# Patient Record
Sex: Female | Born: 1990 | Race: Black or African American | Hispanic: No | Marital: Single | Smoking: Current some day smoker
Health system: Southern US, Community
[De-identification: ages and names within clinical notes are randomized; demographics above are authoritative.]

## PROBLEM LIST (undated history)

## (undated) DIAGNOSIS — F329 Major depressive disorder, single episode, unspecified: Secondary | ICD-10-CM

## (undated) DIAGNOSIS — F32A Depression, unspecified: Secondary | ICD-10-CM

## (undated) HISTORY — PX: TUBAL LIGATION: SHX77

## (undated) HISTORY — PX: SHOULDER ARTHROSCOPY: SHX128

---
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
## 2018-05-20 ENCOUNTER — Emergency Department: Payer: Self-pay

## 2018-05-20 ENCOUNTER — Emergency Department
Admission: EM | Admit: 2018-05-20 | Discharge: 2018-05-20 | Disposition: A | Discharge status: Home / Self Care | Payer: Self-pay | Attending: Emergency Medicine | Admitting: Emergency Medicine

## 2018-05-20 DIAGNOSIS — H1033 Unspecified acute conjunctivitis, bilateral: Secondary | ICD-10-CM | POA: Insufficient documentation

## 2018-05-20 DIAGNOSIS — J069 Acute upper respiratory infection, unspecified: Secondary | ICD-10-CM | POA: Insufficient documentation

## 2018-05-20 DIAGNOSIS — E876 Hypokalemia: Secondary | ICD-10-CM | POA: Insufficient documentation

## 2018-05-20 DIAGNOSIS — R0981 Nasal congestion: Secondary | ICD-10-CM

## 2018-05-20 DIAGNOSIS — Z87891 Personal history of nicotine dependence: Secondary | ICD-10-CM | POA: Insufficient documentation

## 2018-05-20 LAB — CBC
HEMATOCRIT: 41.6 % (ref 35.0–47.0)
Hemoglobin: 14.2 g/dL (ref 12.0–16.0)
MCH: 31.2 pg (ref 26.0–34.0)
MCHC: 34.1 g/dL (ref 32.0–36.0)
MCV: 91.7 fL (ref 80.0–100.0)
Platelets: 208 10*3/uL (ref 150–440)
RBC: 4.54 MIL/uL (ref 3.80–5.20)
RDW: 13.3 % (ref 11.5–14.5)
WBC: 8.5 10*3/uL (ref 3.6–11.0)

## 2018-05-20 LAB — COMPREHENSIVE METABOLIC PANEL
ALBUMIN: 4.4 g/dL (ref 3.5–5.0)
ALT: 17 U/L (ref 14–54)
ANION GAP: 13 (ref 5–15)
AST: 23 U/L (ref 15–41)
Alkaline Phosphatase: 57 U/L (ref 38–126)
BILIRUBIN TOTAL: 0.4 mg/dL (ref 0.3–1.2)
CHLORIDE: 101 mmol/L (ref 101–111)
CO2: 22 mmol/L (ref 22–32)
Calcium: 9 mg/dL (ref 8.9–10.3)
Creatinine, Ser: 0.81 mg/dL (ref 0.44–1.00)
GFR calc Af Amer: >60 mL/min (ref >60)
GFR calc non Af Amer: >60 mL/min (ref >60)
GLUCOSE: 164 mg/dL — AB (ref 65–99)
POTASSIUM: 2.7 mmol/L — AB (ref 3.5–5.1)
SODIUM: 136 mmol/L (ref 135–145)
Total Protein: 8.1 g/dL (ref 6.5–8.1)

## 2018-05-20 LAB — GROUP A STREP BY PCR: GROUP A STREP BY PCR: NOT DETECTED

## 2018-05-20 MED ORDER — CETIRIZINE HCL 10 MG PO TABS: 10.0000 mg | ORAL_TABLET | Freq: Every day | ORAL | 0 refills | Status: DC

## 2018-05-20 MED ORDER — POTASSIUM CHLORIDE CRYS ER 20 MEQ PO TBCR
40.0000 meq | EXTENDED_RELEASE_TABLET | Freq: Once | ORAL | Status: AC
  Administered 2018-05-20: 40 meq via ORAL
  Filled 2018-05-20: qty 2

## 2018-05-20 MED ORDER — POLYMYXIN B-TRIMETHOPRIM 10000-0.1 UNIT/ML-% OP SOLN: OPHTHALMIC | 0 refills | Status: DC

## 2018-05-20 MED ORDER — POTASSIUM CHLORIDE CRYS ER 20 MEQ PO TBCR: 20.0000 meq | EXTENDED_RELEASE_TABLET | Freq: Every day | ORAL | 0 refills | Status: DC

## 2018-05-20 MED ORDER — FLUTICASONE FUROATE 27.5 MCG/SPRAY NA SUSP: NASAL | 2 refills | Status: DC

## 2018-05-20 NOTE — ED Provider Notes (Signed)
[facility] Emergency Department Provider Note  ____________________________________________   First MD Initiated Contact with Patient 05/20/18 [phone]     (approximate)  I have reviewed the triage vital signs and the nursing notes.   HISTORY  Chief Complaint Eye Drainage; Nasal Congestion; and Cough    HPI Erica Henderson is a 27 y.o. female with no contributory past medical history who presents for evaluation of a variety of complaints.  For about 3 days she has had severe sinus congestion, a mild cough, sore throat, general malaise, body aches, occasional sneezing, etc.  For the last 1 to 2 days she is also had bilateral redness in her eyes with heavy yellowish drainage that causes her eyelids and eyelashes to be matted together when she wakes up with some swelling around the eyes and some associated blurred vision when the drainage is severe.  She has been taking over-the-counter cold medicine and ibuprofen and Tylenol but they have not been helping.  She is not having any difficulty breathing except for the cough and difficulty breathing through all the nasal congestion.  No one else in her family has been sick.  She denies headache, neck pain, neck stiffness, chest pain, nausea, vomiting, and abdominal pain.  She has had several episodes of loose stools and has not been eating well in general because she simply does not have much of an appetite.  She denies dysuria.  Her symptoms are worse when she lies down flat.  History reviewed. No pertinent past medical history.  There are no active problems to display for this patient.   Past Surgical History:  Procedure Laterality Date  . CESAREAN SECTION    . TUBAL LIGATION      Prior to Admission medications   Medication Sig Start Date End Date Taking? Authorizing Provider  cetirizine (ZYRTEC) 10 MG tablet Take 1 tablet (10 mg total) by mouth daily. 05/20/18   Loleta RoseForbach, Hilbert Briggs, MD  fluticasone (VERAMYST) 27.5  MCG/SPRAY nasal spray Place 2 sprays into each nostril daily. 05/20/18   Loleta RoseForbach, Harjit Leider, MD  potassium chloride SA (KLOR-CON M20) 20 MEQ tablet Take 1 tablet (20 mEq total) by mouth daily. 05/20/18   Loleta RoseForbach, Salvator Seppala, MD  trimethoprim-polymyxin b Joaquim Lai(POLYTRIM) ophthalmic solution Put 1 drop in affected eye(s) every 4 hours for 1 week 05/20/18   Loleta RoseForbach, Stevan Eberwein, MD    Allergies Patient has no known allergies.  No family history on file.  Social History Social History   Tobacco Use  . Smoking status: Former Games developermoker  . Smokeless tobacco: Never Used  Substance Use Topics  . Alcohol use: Yes    Comment: socially  . Drug use: Never    Review of Systems Constitutional: Occasional subjective fever.  General malaise and body aches. Eyes: Redness, irritation, and drainage from both eyes ENT: Sore throat, heavy nasal congestion Cardiovascular: Denies chest pain. Respiratory: Occasional cough.  Denies shortness of breath. Gastrointestinal: No abdominal pain.  Decreased appetite.  No nausea, no vomiting.  Some loose stools .  No constipation. Genitourinary: Negative for dysuria. Musculoskeletal: Negative for neck pain.  Negative for back pain. Integumentary: Negative for rash. Neurological: Negative for headaches, focal weakness or numbness.   ____________________________________________   PHYSICAL EXAM:  VITAL SIGNS: ED Triage Vitals [05/20/18 0243]  Enc Vitals Group     BP 137/77     Pulse Rate (!) 114     Resp 20     Temp 98.8 F (37.1 C)     Temp Source  Oral     SpO2 99 %     Weight 67.1 kg (148 lb)     Height 1.702 m (5\' 7" )     Head Circumference      Peak Flow      Pain Score 10     Pain Loc      Pain Edu?      Excl. in GC?     Constitutional: Alert and oriented.  Nontoxic appearance but does appear uncomfortable with viral respiratory symptoms Eyes: Conjunctivae are injected bilaterally with a little bit of thick discharge but mostly cleaned off at this time.  Pupils are equal  and reactive Ears:  Healthy appearing ear canals and TMs bilaterally Nose: Severe congestion/rhinnorhea. Mouth/Throat: Mucous membranes are moist.  Oropharynx non-erythematous.  No evidence of acute bacterial infection including abscess.  Normal voice. Neck: No stridor.  No meningeal signs.   Cardiovascular: Mild tachycardia, regular rhythm. Good peripheral circulation. Grossly normal heart sounds. Respiratory: Normal respiratory effort.  No retractions. Lungs CTAB. Gastrointestinal: Soft and nontender. No distention.  Musculoskeletal: No lower extremity tenderness nor edema. No gross deformities of extremities. Neurologic:  Normal speech and language. No gross focal neurologic deficits are appreciated.  Skin:  Skin is warm, dry and intact. No rash noted. Psychiatric: Mood and affect are normal. Speech and behavior are normal.  ____________________________________________   LABS (all labs ordered are listed, but only abnormal results are displayed)  Labs Reviewed  COMPREHENSIVE METABOLIC PANEL - Abnormal; Notable for the following components:      Result Value   Potassium 2.7 (*)    Glucose, Bld 164 (*)    BUN <5 (*)    All other components within normal limits  GROUP A STREP BY PCR  CBC   ____________________________________________  EKG  None - EKG not ordered by ED physician ____________________________________________  RADIOLOGY Marylou Mccoy, personally viewed and evaluated these images (plain radiographs) as part of my medical decision making, as well as reviewing the written report by the radiologist.  ED MD interpretation: No acute cardiopulmonary abnormality or evidence of pneumonia  Official radiology report(s): Dg Chest 2 View  Result Date: 05/20/2018 CLINICAL DATA:  Cough. EXAM: CHEST - 2 VIEW COMPARISON:  None. FINDINGS: The cardiomediastinal contours are normal. The lungs are clear. Pulmonary vasculature is normal. No consolidation, pleural effusion, or  pneumothorax. No acute osseous abnormalities are seen. IMPRESSION: Normal radiographs of the chest. Electronically Signed   By: Rubye Oaks M.D.   On: 05/20/2018 02:59    ____________________________________________   PROCEDURES  Critical Care performed: No   Procedure(s) performed:   Procedures   ____________________________________________   INITIAL IMPRESSION / ASSESSMENT AND PLAN / ED COURSE  As part of my medical decision making, I reviewed the following data within the electronic MEDICAL RECORD NUMBER Nursing notes reviewed and incorporated, Labs reviewed  and EKG interpreted     Differential diagnosis includes, but is not limited to, viral respiratory infection, nonspecific influenza-like illness, strep pharyngitis, pneumonia, conjunctivitis.  Her symptoms almost seem like a combination of allergies and viral respiratory infection, although she does not apparently regularly suffer from seasonal allergies.  Given that most of her symptoms are in her sinuses/nose, I will treat her with multiple medications to try to ease her symptoms.  I am recommending fluticasone nasal spray as well as cetirizine to help with the allergic-like symptoms.  She has no evidence of orbital cellulitis or periorbital cellulitis but I do believe she is suffering from a  form of conjunctivitis, whether viral, allergic, bacterial, or a combination.  I will treat her with Polytrim drops (she does not use contact lenses).  I gave her my usual customary viral infection and specifically conjunctivitis instructions and recommendations.  There is no evidence of pneumonia and her lab work is all reassuring except for a low potassium of 2.7 which I repleted with 40 mEq by mouth and strongly encouraged her to take a potassium supplement as well as increase the potassium, and her diet and try to drink plenty of fluids while she is getting over this acute illness.  She understands and agrees with the plan.   I gave my  usual and customary return precautions.      ____________________________________________  FINAL CLINICAL IMPRESSION(S) / ED DIAGNOSES  Final diagnoses:  Viral upper respiratory tract infection  Acute conjunctivitis of both eyes, unspecified acute conjunctivitis type  Nasal congestion  Hypokalemia     MEDICATIONS GIVEN DURING THIS VISIT:  Medications  potassium chloride SA (K-DUR,KLOR-CON) CR tablet 40 mEq (40 mEq Oral Given 05/20/18 1610)     ED Discharge Orders        Ordered    potassium chloride SA (KLOR-CON M20) 20 MEQ tablet  Daily     05/20/18 0637    fluticasone (VERAMYST) 27.5 MCG/SPRAY nasal spray     05/20/18 0637    trimethoprim-polymyxin b (POLYTRIM) ophthalmic solution     05/20/18 0637    cetirizine (ZYRTEC) 10 MG tablet  Daily     05/20/18 9604       Note:  This document was prepared using Dragon voice recognition software and may include unintentional dictation errors.    Loleta Rose, MD 05/20/18 [phone]

## 2018-05-20 NOTE — ED Triage Notes (Signed)
Patient c/o yellow eye drainage, swelling, and blurred vision. Patient c/o sinus congestion, sore throat, and cough.

## 2018-05-20 NOTE — Discharge Instructions (Signed)
You have been seen in the Emergency Department (ED) today for a likely viral illness.  Please drink plenty of clear fluids (water, Gatorade, chicken broth, etc).  You may use Tylenol and/or Motrin according to label instructions.  You can alternate between the two without any side effects.   Please also use the prescriptions provided; cetirizine, fluticasone nasal spray, and potassium chloride are all available over-the-counter, but we provided prescriptions to be helpful in tracking down what you need.  The eye drops are a prescription medication; please take them according to label instructions.  Please follow up with your doctor as listed above.  Call your doctor or return to the Emergency Department (ED) if you are unable to tolerate fluids due to vomiting, have worsening trouble breathing, become extremely tired or difficult to awaken, or if you develop any other symptoms that concern you.

## 2018-05-20 NOTE — ED Notes (Signed)
Patient transported to X-ray 

## 2018-08-13 ENCOUNTER — Emergency Department
Admission: EM | Admit: 2018-08-13 | Discharge: 2018-08-13 | Disposition: A | Discharge status: Home / Self Care | Payer: Medicaid Other | Attending: Emergency Medicine | Admitting: Emergency Medicine

## 2018-08-13 ENCOUNTER — Encounter: Payer: Self-pay | Admitting: Emergency Medicine

## 2018-08-13 ENCOUNTER — Other Ambulatory Visit: Payer: Self-pay

## 2018-08-13 ENCOUNTER — Emergency Department: Payer: Medicaid Other

## 2018-08-13 DIAGNOSIS — R1032 Left lower quadrant pain: Secondary | ICD-10-CM | POA: Insufficient documentation

## 2018-08-13 DIAGNOSIS — Z87891 Personal history of nicotine dependence: Secondary | ICD-10-CM | POA: Diagnosis not present

## 2018-08-13 DIAGNOSIS — N921 Excessive and frequent menstruation with irregular cycle: Secondary | ICD-10-CM

## 2018-08-13 DIAGNOSIS — Z79899 Other long term (current) drug therapy: Secondary | ICD-10-CM | POA: Diagnosis not present

## 2018-08-13 LAB — URINALYSIS, COMPLETE (UACMP) WITH MICROSCOPIC
BACTERIA UA: NONE SEEN
Bilirubin Urine: NEGATIVE
Glucose, UA: NEGATIVE mg/dL
Ketones, ur: NEGATIVE mg/dL
Leukocytes, UA: NEGATIVE
NITRITE: NEGATIVE
Protein, ur: NEGATIVE mg/dL
SPECIFIC GRAVITY, URINE: 1.024 (ref 1.005–1.030)
pH: 6 (ref 5.0–8.0)

## 2018-08-13 LAB — CBC
HEMATOCRIT: 37.9 % (ref 35.0–47.0)
HEMOGLOBIN: 12.8 g/dL (ref 12.0–16.0)
MCH: 31.4 pg (ref 26.0–34.0)
MCHC: 33.9 g/dL (ref 32.0–36.0)
MCV: 92.7 fL (ref 80.0–100.0)
Platelets: 180 10*3/uL (ref 150–440)
RBC: 4.08 MIL/uL (ref 3.80–5.20)
RDW: 13.6 % (ref 11.5–14.5)
WBC: 4.5 10*3/uL (ref 3.6–11.0)

## 2018-08-13 LAB — COMPREHENSIVE METABOLIC PANEL
ALBUMIN: 3.9 g/dL (ref 3.5–5.0)
ALT: 13 U/L (ref 0–44)
ANION GAP: 7 (ref 5–15)
AST: 22 U/L (ref 15–41)
Alkaline Phosphatase: 40 U/L (ref 38–126)
BILIRUBIN TOTAL: 0.5 mg/dL (ref 0.3–1.2)
BUN: 8 mg/dL (ref 6–20)
CHLORIDE: 109 mmol/L (ref 98–111)
CO2: 24 mmol/L (ref 22–32)
Calcium: 8.5 mg/dL — ABNORMAL LOW (ref 8.9–10.3)
Creatinine, Ser: 0.7 mg/dL (ref 0.44–1.00)
GFR calc Af Amer: >60 mL/min (ref >60)
GFR calc non Af Amer: >60 mL/min (ref >60)
GLUCOSE: 124 mg/dL — AB (ref 70–99)
POTASSIUM: 3.6 mmol/L (ref 3.5–5.1)
SODIUM: 140 mmol/L (ref 135–145)
Total Protein: 6.8 g/dL (ref 6.5–8.1)

## 2018-08-13 LAB — PREGNANCY, URINE: PREG TEST UR: NEGATIVE

## 2018-08-13 LAB — LIPASE, BLOOD: Lipase: 22 U/L (ref 11–51)

## 2018-08-13 MED ORDER — NAPROXEN 500 MG PO TABS: 500.0000 mg | ORAL_TABLET | Freq: Two times a day (BID) | ORAL | 0 refills | Status: DC

## 2018-08-13 NOTE — ED Triage Notes (Signed)
Pt reports abdominal pain to her lower abd for the past year but this weekend has gotten worse. Pt denies urinary sx's. Pt reports she did start her menstrual cycle and she has already had one this month. Pt reports her lower abdomen feels bruised when she touches it.

## 2018-08-13 NOTE — ED Notes (Signed)
Pt denies N/V or diarrhea.

## 2018-08-13 NOTE — ED Provider Notes (Addendum)
[facility] Emergency Department Provider Note  ____________________________________________  Time seen: Approximately 2:02 PM  I have reviewed the triage vital signs and the nursing notes.   HISTORY  Chief Complaint Abdominal Pain    HPI Erica Henderson is a 27 y.o. female with a history of C-section and tubal ligation but no medical history who complains of left lower quadrant abdominal pain off and on for the past year, worse in the last 2 days.  Worse with movement and change in position.  No vomiting diarrhea or constipation.  No fevers or chills.  She does report irregular menstrual periods, having usual.  At the beginning of this month and then starting again 2 days ago.  No regular vaginal discharge.  No dysuria frequency urgency.  Pain in the left lower quadrant is waxing and waning, radiates upward into the left upper quadrant.  Not to the back, not to the groin.      History reviewed. No pertinent past medical history.   There are no active problems to display for this patient.    Past Surgical History:  Procedure Laterality Date  . CESAREAN SECTION    . TUBAL LIGATION       Prior to Admission medications   Medication Sig Start Date End Date Taking? Authorizing Provider  cetirizine (ZYRTEC) 10 MG tablet Take 1 tablet (10 mg total) by mouth daily. 05/20/18   Loleta Rose, MD  fluticasone (VERAMYST) 27.5 MCG/SPRAY nasal spray Place 2 sprays into each nostril daily. 05/20/18   Loleta Rose, MD  naproxen (NAPROSYN) 500 MG tablet Take 1 tablet (500 mg total) by mouth 2 (two) times daily with a meal. 08/13/18   Sharman Cheek, MD  potassium chloride SA (KLOR-CON M20) 20 MEQ tablet Take 1 tablet (20 mEq total) by mouth daily. 05/20/18   Loleta Rose, MD  trimethoprim-polymyxin b Joaquim Lai) ophthalmic solution Put 1 drop in affected eye(s) every 4 hours for 1 week 05/20/18   Loleta Rose, MD     Allergies Patient has no known  allergies.   No family history on file.  Social History Social History   Tobacco Use  . Smoking status: Former Games developer  . Smokeless tobacco: Never Used  Substance Use Topics  . Alcohol use: Yes    Comment: socially  . Drug use: Never    Review of Systems  Constitutional:   No fever or chills.  ENT:   No sore throat. No rhinorrhea. Cardiovascular:   No chest pain or syncope. Respiratory:   No dyspnea or cough. Gastrointestinal:   With abdominal pain as above without vomiting and diarrhea.  Musculoskeletal:   Negative for focal pain or swelling All other systems reviewed and are negative except as documented above in ROS and HPI.  ____________________________________________   PHYSICAL EXAM:  VITAL SIGNS: ED Triage Vitals  Enc Vitals Group     BP 08/13/18 1004 127/73     Pulse Rate 08/13/18 1004 91     Resp --      Temp 08/13/18 1004 98.9 F (37.2 C)     Temp Source 08/13/18 1004 Oral     SpO2 08/13/18 1004 100 %     Weight 08/13/18 1004 164 lb (74.4 kg)     Height 08/13/18 1004 5\' 7"  (1.702 m)     Head Circumference --      Peak Flow --      Pain Score 08/13/18 1007 8     Pain Loc --  Pain Edu? --      Excl. in GC? --     Vital signs reviewed, nursing assessments reviewed.   Constitutional:   Alert and oriented. Non-toxic appearance. Eyes:   Conjunctivae are normal. EOMI. PERRL. ENT      Head:   Normocephalic and atraumatic.      Nose:   No congestion/rhinnorhea.       Mouth/Throat:   MMM, no pharyngeal erythema. No peritonsillar mass.       Neck:   No meningismus. Full ROM. Hematological/Lymphatic/Immunilogical:   No cervical lymphadenopathy. Cardiovascular:   RRR. Symmetric bilateral radial and DP pulses.  No murmurs. Cap refill less than 2 seconds. Respiratory:   Normal respiratory effort without tachypnea/retractions. Breath sounds are clear and equal bilaterally. No wheezes/rales/rhonchi. Gastrointestinal:   Soft and nontender. Non distended.  There is no CVA tenderness.  No rebound, rigidity, or guarding. Musculoskeletal:   Normal range of motion in all extremities. No joint effusions.  No lower extremity tenderness.  No edema. Neurologic:   Normal speech and language.  Motor grossly intact. No acute focal neurologic deficits are appreciated.  Skin:    Skin is warm, dry and intact. No rash noted.  No petechiae, purpura, or bullae.  ____________________________________________    LABS (pertinent positives/negatives) (all labs ordered are listed, but only abnormal results are displayed) Labs Reviewed  COMPREHENSIVE METABOLIC PANEL - Abnormal; Notable for the following components:      Result Value   Glucose, Bld 124 (*)    Calcium 8.5 (*)    All other components within normal limits  URINALYSIS, COMPLETE (UACMP) WITH MICROSCOPIC - Abnormal; Notable for the following components:   Color, Urine YELLOW (*)    APPearance HAZY (*)    Hgb urine dipstick SMALL (*)    All other components within normal limits  LIPASE, BLOOD  CBC  PREGNANCY, URINE   ____________________________________________   EKG    ____________________________________________    RADIOLOGY  US Transvaginal Non-ob  Result Date: 08/13/2018 CLINICAL DATA:  Initial evaluation for acute left lower quadrant pain, vaginal bleeding. EXAM: TRANSABDOMINAL AND TRANSVAGINAL ULTRASOUND OF PELVIS DOPPLER ULTRASOUND OF OVARIES TECHNIQUE: Both transabdominal and transvaginal ultrasound examinations of the pelvis were performed. Transabdominal technique was performed for global imaging of the pelvis including uterus, ovaries, adnexal regions, and pelvic cul-de-sac. It was necessary to proceed with endovaginal exam following the transabdominal exam to visualize the uterus, endometrium, and ovaries. Color and duplex Doppler ultrasound was utilized to evaluate blood flow to the ovaries. COMPARISON:  None. FINDINGS: Uterus Measurements: 7.8 x 4.0 x 5.4 cm. No fibroids or  other mass visualized. Endometrium Thickness: 11 mm.  No focal abnormality visualized. Right ovary Measurements: 3.5 x 2.0 x 2.9 cm. Normal appearance/no adnexal mass. Incidental note made of a 1.1 x 1.3 x 1.2 cm dominant follicle. Left ovary Measurements: 2.2 x 2.4 x 1.9 cm. Normal appearance/no adnexal mass. Pulsed Doppler evaluation of both ovaries demonstrates normal low-resistance arterial and venous waveforms. Other findings Small volume free fluid within the left adnexa. IMPRESSION: 1. Negative pelvic ultrasound. No evidence for torsion or other acute abnormality. 2. Small volume free physiologic fluid within the left adnexa. Electronically Signed   By: Rise Mu M.D.   On: 08/13/2018 14:55   US Pelvis Complete  Result Date: 08/13/2018 CLINICAL DATA:  Initial evaluation for acute left lower quadrant pain, vaginal bleeding. EXAM: TRANSABDOMINAL AND TRANSVAGINAL ULTRASOUND OF PELVIS DOPPLER ULTRASOUND OF OVARIES TECHNIQUE: Both transabdominal and transvaginal ultrasound examinations of the  pelvis were performed. Transabdominal technique was performed for global imaging of the pelvis including uterus, ovaries, adnexal regions, and pelvic cul-de-sac. It was necessary to proceed with endovaginal exam following the transabdominal exam to visualize the uterus, endometrium, and ovaries. Color and duplex Doppler ultrasound was utilized to evaluate blood flow to the ovaries. COMPARISON:  None. FINDINGS: Uterus Measurements: 7.8 x 4.0 x 5.4 cm. No fibroids or other mass visualized. Endometrium Thickness: 11 mm.  No focal abnormality visualized. Right ovary Measurements: 3.5 x 2.0 x 2.9 cm. Normal appearance/no adnexal mass. Incidental note made of a 1.1 x 1.3 x 1.2 cm dominant follicle. Left ovary Measurements: 2.2 x 2.4 x 1.9 cm. Normal appearance/no adnexal mass. Pulsed Doppler evaluation of both ovaries demonstrates normal low-resistance arterial and venous waveforms. Other findings Small volume free  fluid within the left adnexa. IMPRESSION: 1. Negative pelvic ultrasound. No evidence for torsion or other acute abnormality. 2. Small volume free physiologic fluid within the left adnexa. Electronically Signed   By: Rise MuBenjamin  McClintock M.D.   On: 08/13/2018 14:55   [place] Art/ven Flow Abd Pelv Doppler  Result Date: 08/13/2018 CLINICAL DATA:  Initial evaluation for acute left lower quadrant pain, vaginal bleeding. EXAM: TRANSABDOMINAL AND TRANSVAGINAL ULTRASOUND OF PELVIS DOPPLER ULTRASOUND OF OVARIES TECHNIQUE: Both transabdominal and transvaginal ultrasound examinations of the pelvis were performed. Transabdominal technique was performed for global imaging of the pelvis including uterus, ovaries, adnexal regions, and pelvic cul-de-sac. It was necessary to proceed with endovaginal exam following the transabdominal exam to visualize the uterus, endometrium, and ovaries. Color and duplex Doppler ultrasound was utilized to evaluate blood flow to the ovaries. COMPARISON:  None. FINDINGS: Uterus Measurements: 7.8 x 4.0 x 5.4 cm. No fibroids or other mass visualized. Endometrium Thickness: 11 mm.  No focal abnormality visualized. Right ovary Measurements: 3.5 x 2.0 x 2.9 cm. Normal appearance/no adnexal mass. Incidental note made of a 1.1 x 1.3 x 1.2 cm dominant follicle. Left ovary Measurements: 2.2 x 2.4 x 1.9 cm. Normal appearance/no adnexal mass. Pulsed Doppler evaluation of both ovaries demonstrates normal low-resistance arterial and venous waveforms. Other findings Small volume free fluid within the left adnexa. IMPRESSION: 1. Negative pelvic ultrasound. No evidence for torsion or other acute abnormality. 2. Small volume free physiologic fluid within the left adnexa. Electronically Signed   By: Rise MuBenjamin  McClintock M.D.   On: 08/13/2018 14:55    ____________________________________________   PROCEDURES Procedures  ____________________________________________  DIFFERENTIAL DIAGNOSIS   Ovarian cyst.   Unlikely TOA STI PID or torsion.  Possibly symptomatic scar tissue  CLINICAL IMPRESSION / ASSESSMENT AND PLAN / ED COURSE  Pertinent labs & imaging results that were available during my care of the patient were reviewed by me and considered in my medical decision making (see chart for details).    Patient presents with intermittent left lower quadrant abdominal pain.  Symptoms are somewhat vague and acute on chronic.  Vital signs are normal, exam is nonfocal.  Check labs, ultrasound pelvis.  Clinical Course as of Aug 13 1534  Thu Aug 13, 2018  1401 Not pregnant.  Work-up so far negative.  Will obtain an ultrasound to further evaluate.  Plan to refer to gynecology follow-up if results are overall reassuring.   [PS]    Clinical Course User Index [PS] Sharman CheekStafford, Tatumn Corbridge, MD     ----------------------------------------- 3:36 PM on 08/13/2018 -----------------------------------------  Ultrasound negative except for a small amount of free fluid in the pelvis and the left, suggestive of recently ruptured ovarian cyst.  No evidence of torsion or other acute pathology, suitable for discharge home to follow-up with gynecology.  ____________________________________________   FINAL CLINICAL IMPRESSION(S) / ED DIAGNOSES    Final diagnoses:  LLQ pain  Metrorrhagia     ED Discharge Orders         Ordered    naproxen (NAPROSYN) 500 MG tablet  2 times daily with meals     08/13/18 1533          Portions of this note were generated with dragon dictation software. Dictation errors may occur despite best attempts at proofreading.    Sharman Cheek, MD 08/13/18 1404    Sharman Cheek, MD 08/13/18 1536

## 2018-08-13 NOTE — Discharge Instructions (Signed)
Your ultrasound of the pelvis was okay today.  It shows a small amount of fluid in the pelvis which may be due to a recently ruptured ovarian cyst. Take anti-inflammatory pain medicine and monitor your symptoms. Please follow up with gynecology regarding the irregular vaginal bleeding.

## 2019-05-21 ENCOUNTER — Other Ambulatory Visit: Payer: Self-pay | Admitting: Family Medicine

## 2019-05-21 DIAGNOSIS — R1032 Left lower quadrant pain: Secondary | ICD-10-CM

## 2019-05-21 DIAGNOSIS — R102 Pelvic and perineal pain: Secondary | ICD-10-CM

## 2019-05-27 ENCOUNTER — Ambulatory Visit: Admission: RE | Admit: 2019-05-27 | Payer: Medicaid Other | Source: Ambulatory Visit

## 2019-06-02 ENCOUNTER — Ambulatory Visit: Admission: RE | Admit: 2019-06-02 | Payer: Medicaid Other | Source: Ambulatory Visit

## 2019-10-07 ENCOUNTER — Ambulatory Visit
Admission: RE | Admit: 2019-10-07 | Discharge: 2019-10-07 | Disposition: A | Discharge status: Home / Self Care | Payer: Medicaid Other | Source: Ambulatory Visit | Attending: Family Medicine | Admitting: Family Medicine

## 2019-10-07 ENCOUNTER — Other Ambulatory Visit: Payer: Self-pay

## 2019-10-07 ENCOUNTER — Encounter (INDEPENDENT_AMBULATORY_CARE_PROVIDER_SITE_OTHER): Payer: Self-pay

## 2019-10-07 DIAGNOSIS — R102 Pelvic and perineal pain: Secondary | ICD-10-CM | POA: Diagnosis present

## 2019-10-07 DIAGNOSIS — R1032 Left lower quadrant pain: Secondary | ICD-10-CM | POA: Diagnosis present

## 2019-10-07 MED ORDER — IOHEXOL 300 MG/ML  SOLN
100.0000 mL | Freq: Once | INTRAMUSCULAR | Status: AC | PRN
  Administered 2019-10-07: 100 mL via INTRAVENOUS

## 2019-11-02 ENCOUNTER — Other Ambulatory Visit
Admission: RE | Admit: 2019-11-02 | Discharge: 2019-11-02 | Disposition: A | Discharge status: Home / Self Care | Payer: Medicaid Other | Source: Ambulatory Visit | Attending: Internal Medicine | Admitting: Internal Medicine

## 2019-11-02 DIAGNOSIS — Z20828 Contact with and (suspected) exposure to other viral communicable diseases: Secondary | ICD-10-CM | POA: Insufficient documentation

## 2019-11-02 DIAGNOSIS — Z01812 Encounter for preprocedural laboratory examination: Secondary | ICD-10-CM | POA: Insufficient documentation

## 2019-11-03 LAB — SARS CORONAVIRUS 2 (TAT 6-24 HRS): SARS Coronavirus 2: NEGATIVE

## 2020-01-14 ENCOUNTER — Ambulatory Visit: Payer: Self-pay | Admitting: Surgery

## 2020-01-14 NOTE — H&P (Signed)
Subjective:   CC: Abdominal pain, LLQ [R10.32]  HPI:  Erica Henderson is a 29 y.o. female who was referred by Clent Jacks, PA for evaluation of above. First noted 4 years ago since c-section and tubal ligation.   Symptoms include: Pain is burning, worsening the last few months, localized to LLQ.  Exacerbated during her menstrual cycle.  Alleviated barely with some advil, but has not tried anything more.  Associated with wt loss.  Intermittent burning pain into her entire leg down to her feet and back as well.  This is intermittent.  This is also worse with her mensturation  Also reports irregular BMs, recent colonoscopy showed no signs of colitis.     Past Medical History:  has a past medical history of Diet controlled gestational diabetes mellitus (GDM) in third trimester (10/04/2015), preeclampsia, prior pregnancy, currently pregnant, Mild pre-eclampsia, third trimester (11/01/2015), Personal history of other specified diseases(V13.89), and PONV (postoperative nausea and vomiting).  Past Surgical History:  has a past surgical history that includes Cesarean section; arthroscopy shoulder capsulorrhaphy (Right, 01/26/2015); cesarean delivery (Bilateral, 11/02/2015); Tubal ligation; and Colonoscopy (11/06/2019).  Family History: family history includes Aortic dissection in her brother; Marfan syndrome in her brother; No Known Problems in her brother and father; Stroke in her mother.  Social History:  reports that she quit smoking about 8 years ago. She smoked 0.50 packs per day. She has quit using smokeless tobacco. She reports current drug use. She reports that she does not drink alcohol.  Current Medications: has a current medication list which includes the following prescription(s): gabapentin, melatonin, and sertraline.  Allergies:  No Known Allergies  ROS:  A 15 point review of systems was performed and pertinent positives and negatives noted in HPI   Objective:   BP  107/78   Pulse 98   Ht 170.2 cm (5\' 7" )   Wt 64.9 kg (143 lb)   BMI 22.40 kg/m   Constitutional :  alert, appears stated age, cooperative and no distress  Lymphatics/Throat:  no asymmetry, masses, or scars  Respiratory:  clear to auscultation bilaterally  Cardiovascular:  regular rate and rhythm  Gastrointestinal: Soft, no guarding.  TTP in LLQ, slightly above inguinal ligament, no obvious mass, overlying skin change.  Slight prominence of pannus on left compared to right, but no hernia noted standing or supine.  Musculoskeletal: Steady gait and movement  Skin: Cool and moist  Psychiatric: Normal affect, non-agitated, not confused       LABS:   n/a  RADS: n/a  Assessment:      Abdominal pain, LLQ [R10.32]  Plan:   1. Abdominal pain, LLQ [R10.32]   Unlikely to be related to anything surgical, consistent worsening with her menstrual cycle concerning for GYN origin, but GYN workup completed last year with no obvious cause.  Recommended second opinion from another surgeon per patient request prior to considering diagnostic laparoscopy, although I specifically stated even a diagnostic laparoscopy will be very unlikely to reveal any cause of her pain. She will f/u with our office when ready to schedule a diagnostic laparoscopy, understanding low yield and all risks involved.

## 2020-01-21 ENCOUNTER — Other Ambulatory Visit: Payer: Self-pay

## 2020-01-21 ENCOUNTER — Encounter
Admission: RE | Admit: 2020-01-21 | Discharge: 2020-01-21 | Disposition: A | Discharge status: Home / Self Care | Payer: Medicaid Other | Source: Ambulatory Visit | Attending: Surgery | Admitting: Surgery

## 2020-01-21 DIAGNOSIS — Z01812 Encounter for preprocedural laboratory examination: Secondary | ICD-10-CM | POA: Insufficient documentation

## 2020-01-21 HISTORY — DX: Depression, unspecified: F32.A

## 2020-01-21 NOTE — Patient Instructions (Signed)
Your procedure is scheduled on: Friday 01/28/20.  Report to DAY SURGERY DEPARTMENT LOCATED ON 2ND FLOOR MEDICAL MALL ENTRANCE. To find out your arrival time please call [phone] between 1PM - 3PM on Thursday 01/27/20.   Remember: Instructions that are not followed completely may result in serious medical risk, up to and including death, or upon the discretion of your surgeon and anesthesiologist your surgery may need to be rescheduled.      _X__ 1. Do not eat food after midnight the night before your procedure.                 No gum chewing or hard candies. You may drink clear liquids up to 2 hours                 before you are scheduled to arrive for your surgery- DO NOT drink clear                 liquids within 2 hours of the start of your surgery.                 Clear Liquids include:  water, apple juice without pulp, clear carbohydrate                 drink such as Clearfast or Gatorade, Black Coffee or Tea (Do not add                 anything to coffee or tea).    __X__2.  On the morning of surgery brush your teeth with toothpaste and water, you may rinse your mouth with mouthwash if you wish.  Do not swallow any toothpaste or mouthwash.       _X__ 3.  No Alcohol for 24 hours before or after surgery.     _X__ 4.  Do Not Smoke or use e-cigarettes For 24 Hours Prior to Your Surgery.                 Do not use any chewable tobacco products for at least 6 hours prior to                 surgery.   __X__5.  Notify your doctor if there is any change in your medical condition      (cold, fever, infections).       Do not wear jewelry, make-up, hairpins, clips or nail polish. Do not wear lotions, powders, or perfumes.  Do not shave 48 hours prior to surgery. Men may shave face and neck. Do not bring valuables to the hospital.      [facility] is not responsible for any belongings or valuables.    Contacts, dentures/partials or body piercings may not be worn into  surgery. Bring a case for your contacts, glasses or hearing aids, a denture cup will be supplied.     Patients discharged the day of surgery will not be allowed to drive home.     __X__ Take these medicines the morning of surgery with A SIP OF WATER:     1. sertraline (ZOLOFT) 100 MG tablet       __X__ Use CHG Soap as directed   __X__ Stop Anti-inflammatories 7 days before surgery such as Advil, Ibuprofen, Motrin, BC or Goodies Powder, Naprosyn, Naproxen, Aleve, Aspirin, Meloxicam. May take Tylenol if needed for pain or discomfort.    __X__ Don't start taking any new herbal supplements before your procedure.

## 2020-01-26 ENCOUNTER — Other Ambulatory Visit: Admission: RE | Admit: 2020-01-26 | Payer: Medicaid Other | Source: Ambulatory Visit

## 2020-01-28 ENCOUNTER — Encounter: Admission: RE | Payer: Self-pay | Source: Home / Self Care

## 2020-01-28 ENCOUNTER — Ambulatory Visit: Admission: RE | Admit: 2020-01-28 | Payer: Medicaid Other | Source: Home / Self Care | Admitting: Surgery

## 2020-01-28 SURGERY — LAPAROSCOPY, DIAGNOSTIC, ROBOT-ASSISTED
Anesthesia: General | Site: Abdomen

## 2020-03-08 ENCOUNTER — Encounter: Payer: Medicaid Other | Admitting: Obstetrics and Gynecology

## 2020-03-14 ENCOUNTER — Other Ambulatory Visit: Payer: Self-pay

## 2020-03-14 ENCOUNTER — Encounter: Payer: Self-pay | Admitting: Obstetrics and Gynecology

## 2020-03-14 ENCOUNTER — Ambulatory Visit (INDEPENDENT_AMBULATORY_CARE_PROVIDER_SITE_OTHER): Payer: Medicaid Other | Admitting: Obstetrics and Gynecology

## 2020-03-14 VITALS — BP 115/78 | HR 85 | Ht 67.0 in | Wt 149.3 lb

## 2020-03-14 DIAGNOSIS — R102 Pelvic and perineal pain: Secondary | ICD-10-CM

## 2020-03-14 NOTE — Progress Notes (Signed)
HPI:      [name] is a 29 y.o. G2P0 who LMP was Patient's last menstrual period was 03/01/2020.  Subjective:   She presents today with complaint of left-sided pelvic pain ever since she had a tubal ligation.  She has had 2 prior cesarean deliveries and a tubal ligation after the second.  She describes the pain as a pulling sensation that is constant.  She feels that the left-sided pain when she lies on her right side it seems to become worse. She also complains of mood changes and being depressed during her menstrual period because of the heavy bleeding and pain associated with her menses. A CT done within the last 6 months showed no pelvic abnormalities. Patient had chlamydia recently but had appropriate treatment and then a negative test of cure.  She is no longer with that partner.    Hx: The following portions of the patient's history were reviewed and updated as appropriate:             She  has a past medical history of Depression. She does not have a problem list on file. She  has a past surgical history that includes Tubal ligation; Cesarean section; and Shoulder arthroscopy (Right). Her family history is not on file. She  reports that she has quit smoking. She has never used smokeless tobacco. She reports current alcohol use. She reports current drug use. Drug: Marijuana. She has a current medication list which includes the following prescription(s): sertraline, cetirizine, fluticasone, naproxen, potassium chloride sa, and trimethoprim-polymyxin b. She has No Known Allergies.       Review of Systems:  Review of Systems  Constitutional: Denied constitutional symptoms, night sweats, recent illness, fatigue, fever, insomnia and weight loss.  Eyes: Denied eye symptoms, eye pain, photophobia, vision change and visual disturbance.  Ears/Nose/Throat/Neck: Denied ear, nose, throat or neck symptoms, hearing loss, nasal discharge, sinus congestion and sore throat.   Cardiovascular: Denied cardiovascular symptoms, arrhythmia, chest pain/pressure, edema, exercise intolerance, orthopnea and palpitations.  Respiratory: Denied pulmonary symptoms, asthma, pleuritic pain, productive sputum, cough, dyspnea and wheezing.  Gastrointestinal: Denied, gastro-esophageal reflux, melena, nausea and vomiting.  Genitourinary: See HPI for additional information.  Musculoskeletal: Denied musculoskeletal symptoms, stiffness, swelling, muscle weakness and myalgia.  Dermatologic: Denied dermatology symptoms, rash and scar.  Neurologic: Denied neurology symptoms, dizziness, headache, neck pain and syncope.  Psychiatric: Denied psychiatric symptoms, anxiety and depression.  Endocrine: Denied endocrine symptoms including hot flashes and night sweats.   Meds:   Current Outpatient Medications on File Prior to Visit  Medication Sig Dispense Refill  . sertraline (ZOLOFT) 100 MG tablet Take 100 mg by mouth daily.    . cetirizine (ZYRTEC) 10 MG tablet Take 1 tablet (10 mg total) by mouth daily. (Patient not taking: Reported on 01/14/2020) 30 tablet 0  . fluticasone (VERAMYST) 27.5 MCG/SPRAY nasal spray Place 2 sprays into each nostril daily. (Patient not taking: Reported on 01/14/2020) 10 g 2  . naproxen (NAPROSYN) 500 MG tablet Take 1 tablet (500 mg total) by mouth 2 (two) times daily with a meal. (Patient not taking: Reported on 01/14/2020) 20 tablet 0  . potassium chloride SA (KLOR-CON M20) 20 MEQ tablet Take 1 tablet (20 mEq total) by mouth daily. (Patient not taking: Reported on 01/14/2020) 7 tablet 0  . trimethoprim-polymyxin b (POLYTRIM) ophthalmic solution Put 1 drop in affected eye(s) every 4 hours for 1 week (Patient not taking: Reported on 01/14/2020) 10 mL 0   No current facility-administered medications  on file prior to visit.    Objective:     Vitals:   03/14/20 0909  BP: 115/78  Pulse: 85              Abdominal exam:   Pain in the left lower quadrant but not in  the area of hernia.  No masses are palpated.  Physical examination   Pelvic:   Vulva: Normal appearance.  No lesions.  Vagina: No lesions or abnormalities noted.  Support: Normal pelvic support.  Urethra No masses tenderness or scarring.  Meatus Normal size without lesions or prolapse.  Cervix: Normal appearance.  No lesions.  Anus: Normal exam.  No lesions.  Perineum: Normal exam.  No lesions.        Bimanual   Uterus: Normal size.  Non-tender.  Mobile.  AV.  Adnexae: No masses.  Non-tender to palpation.  Cul-de-sac: Negative for abnormality.     Assessment:    G2P0 There are no problems to display for this patient.    1. Pelvic pain in female     Normal pelvic exam.  Pain seems like adhesion pain based on timing with tubal ligation and the pulling sensation.  Endometriosis and uterine fibroids also are a possibility with the localized pain and the increased dysmenorrhea.   Plan:            1.  We have discussed multiple causes of pelvic pain in detail.  Differential diagnosis reviewed.  Possibility of endometriosis, adenomyosis, pelvic adhesive disease, and uterine fibroids specifically discussed.  2.  Pelvic ultrasound  3.  Patient to consider suppression of menses using IUD or OCPs.  If pain continues after suppression consider exploratory laparoscopy.  Patient may choose to have the exploratory laparoscopy first with possible lysis of adhesions diagnosis of endometriosis etc. followed by IUD or OCPs for menstrual cycle control.  Orders Orders Placed This Encounter  Procedures  . US PELVIS (TRANSABDOMINAL ONLY)  . US PELVIS TRANSVAGINAL NON-OB (TV ONLY)    No orders of the defined types were placed in this encounter.     F/U  Return for We will contact her with any abnormal test results. I spent 33 minutes involved in the care of this patient preparing to see the patient by obtaining and reviewing her medical history (including labs, imaging tests and prior  procedures), documenting clinical information in the electronic health record (EHR), counseling and coordinating care plans, writing and sending prescriptions, ordering tests or procedures and directly communicating with the patient by discussing pertinent items from her history and physical exam as well as detailing my assessment and plan as noted above so that she has an informed understanding.  All of her questions were answered.  Finis Bud, M.D. 03/14/2020 9:56 AM

## 2020-03-16 ENCOUNTER — Other Ambulatory Visit: Payer: Self-pay | Admitting: Obstetrics and Gynecology

## 2020-03-16 ENCOUNTER — Other Ambulatory Visit: Payer: Self-pay

## 2020-03-16 ENCOUNTER — Ambulatory Visit (INDEPENDENT_AMBULATORY_CARE_PROVIDER_SITE_OTHER): Payer: Medicaid Other

## 2020-03-16 DIAGNOSIS — N83291 Other ovarian cyst, right side: Secondary | ICD-10-CM | POA: Diagnosis not present

## 2020-03-16 DIAGNOSIS — R102 Pelvic and perineal pain: Secondary | ICD-10-CM

## 2020-03-27 ENCOUNTER — Telehealth: Payer: Self-pay | Admitting: Obstetrics and Gynecology

## 2020-03-27 NOTE — Telephone Encounter (Signed)
Pt called requesting results for u/s that was preformed 4/1. Will you please follow up with her.

## 2020-03-28 NOTE — Telephone Encounter (Signed)
Please advise on results

## 2020-03-29 NOTE — Telephone Encounter (Signed)
LM for patient to return call.

## 2020-04-03 ENCOUNTER — Telehealth: Payer: Self-pay | Admitting: Obstetrics and Gynecology

## 2020-04-03 NOTE — Telephone Encounter (Signed)
See previous message

## 2020-04-03 NOTE — Telephone Encounter (Signed)
Spoke with patient and she does not want to do any type of BC. She would like to proceed with the procedure. I have scheduled her for Pre op appointment this week.

## 2020-04-06 ENCOUNTER — Other Ambulatory Visit: Payer: Self-pay

## 2020-04-06 ENCOUNTER — Encounter: Payer: Self-pay | Admitting: Obstetrics and Gynecology

## 2020-04-06 ENCOUNTER — Ambulatory Visit (INDEPENDENT_AMBULATORY_CARE_PROVIDER_SITE_OTHER): Payer: Medicaid Other | Admitting: Obstetrics and Gynecology

## 2020-04-06 VITALS — BP 119/81 | HR 76 | Ht 67.0 in | Wt 148.0 lb

## 2020-04-06 DIAGNOSIS — R109 Unspecified abdominal pain: Secondary | ICD-10-CM

## 2020-04-06 NOTE — Progress Notes (Signed)
HPI:      [name] is a 29 y.o. G2P0 who LMP was Patient's last menstrual period was 03/22/2020.  Subjective:   She presents today because she was considering surgery rather than an IUD or OCPs for pain. She states that she has nearly constant abdominal pain/pelvic pain. She reports that it is worse with her menses. She reports no pain with intercourse. She does complain that simply brushing her fingers on her abdomen or her shirt touching her abdomen causes discomfort. She points at an area 4 cm below and to the left of the umbilicus.    Hx: The following portions of the patient's history were reviewed and updated as appropriate:             She  has a past medical history of Depression. She does not have a problem list on file. She  has a past surgical history that includes Tubal ligation; Cesarean section; and Shoulder arthroscopy (Right). Her family history is not on file. She  reports that she has quit smoking. She has never used smokeless tobacco. She reports current alcohol use. She reports current drug use. Drug: Marijuana. She has a current medication list which includes the following prescription(s): cetirizine, sertraline, fluticasone, naproxen, potassium chloride sa, and trimethoprim-polymyxin b. She has No Known Allergies.       Review of Systems:  Review of Systems  Constitutional: Denied constitutional symptoms, night sweats, recent illness, fatigue, fever, insomnia and weight loss.  Eyes: Denied eye symptoms, eye pain, photophobia, vision change and visual disturbance.  Ears/Nose/Throat/Neck: Denied ear, nose, throat or neck symptoms, hearing loss, nasal discharge, sinus congestion and sore throat.  Cardiovascular: Denied cardiovascular symptoms, arrhythmia, chest pain/pressure, edema, exercise intolerance, orthopnea and palpitations.  Respiratory: Denied pulmonary symptoms, asthma, pleuritic pain, productive sputum, cough, dyspnea and wheezing.   Gastrointestinal: Denied, gastro-esophageal reflux, melena, nausea and vomiting.  Genitourinary: See HPI for additional information.  Musculoskeletal: Denied musculoskeletal symptoms, stiffness, swelling, muscle weakness and myalgia.  Dermatologic: Denied dermatology symptoms, rash and scar.  Neurologic: Denied neurology symptoms, dizziness, headache, neck pain and syncope.  Psychiatric: Denied psychiatric symptoms, anxiety and depression.  Endocrine: Denied endocrine symptoms including hot flashes and night sweats.   Meds:   Current Outpatient Medications on File Prior to Visit  Medication Sig Dispense Refill  . cetirizine (ZYRTEC) 10 MG tablet Take 1 tablet (10 mg total) by mouth daily. 30 tablet 0  . sertraline (ZOLOFT) 100 MG tablet Take 100 mg by mouth daily.    . fluticasone (VERAMYST) 27.5 MCG/SPRAY nasal spray Place 2 sprays into each nostril daily. (Patient not taking: Reported on 01/14/2020) 10 g 2  . naproxen (NAPROSYN) 500 MG tablet Take 1 tablet (500 mg total) by mouth 2 (two) times daily with a meal. (Patient not taking: Reported on 01/14/2020) 20 tablet 0  . potassium chloride SA (KLOR-CON M20) 20 MEQ tablet Take 1 tablet (20 mEq total) by mouth daily. (Patient not taking: Reported on 01/14/2020) 7 tablet 0  . trimethoprim-polymyxin b (POLYTRIM) ophthalmic solution Put 1 drop in affected eye(s) every 4 hours for 1 week (Patient not taking: Reported on 01/14/2020) 10 mL 0   No current facility-administered medications on file prior to visit.    Objective:     Vitals:   04/06/20 0917  BP: 119/81  Pulse: 76              Abdominal examination reveals a midline mass approximately 1-1/2 cm in diameter. Palpation of this superficial  mass causes pain and pain localized slightly left of midline and below the umbilicus. Gentle palpation of the abdominal wall in this area elicits the pain.  Assessment:    G2P0 There are no problems to display for this patient.    1. Abdominal  pain, unspecified abdominal location     Abdominal wall pain. Possible hernia.  It is also possible patient has adhesions from her tubal ligation which has contributed to her pelvic pain but I believe that her main pain that causes her daily issues is localized to the abdominal wall. Before I perform an exploratory laparoscopy to determine the cause of her pelvic pain I think it would be wise for her to explore the abdominal wall pain.    Plan:            1. Refer to Dr. Doristine Counter for consideration of abdominal wall pain. Orders Orders Placed This Encounter  Procedures  . Ambulatory referral to General Surgery    No orders of the defined types were placed in this encounter.     F/U  No follow-ups on file. I spent 21 minutes involved in the care of this patient preparing to see the patient by obtaining and reviewing her medical history (including labs, imaging tests and prior procedures), documenting clinical information in the electronic health record (EHR), counseling and coordinating care plans, writing and sending prescriptions, ordering tests or procedures and directly communicating with the patient by discussing pertinent items from her history and physical exam as well as detailing my assessment and plan as noted above so that she has an informed understanding.  All of her questions were answered.  Elonda Husky, M.D. 04/06/2020 9:58 AM

## 2020-04-17 ENCOUNTER — Emergency Department
Admission: EM | Admit: 2020-04-17 | Discharge: 2020-04-17 | Disposition: A | Discharge status: Home / Self Care | Payer: Medicaid Other | Attending: Student in an Organized Health Care Education/Training Program | Admitting: Student in an Organized Health Care Education/Training Program

## 2020-04-17 ENCOUNTER — Other Ambulatory Visit: Payer: Self-pay

## 2020-04-17 ENCOUNTER — Emergency Department: Payer: Medicaid Other

## 2020-04-17 DIAGNOSIS — Y92013 Bedroom of single-family (private) house as the place of occurrence of the external cause: Secondary | ICD-10-CM | POA: Insufficient documentation

## 2020-04-17 DIAGNOSIS — F1210 Cannabis abuse, uncomplicated: Secondary | ICD-10-CM | POA: Insufficient documentation

## 2020-04-17 DIAGNOSIS — Z87891 Personal history of nicotine dependence: Secondary | ICD-10-CM | POA: Diagnosis not present

## 2020-04-17 DIAGNOSIS — Y998 Other external cause status: Secondary | ICD-10-CM | POA: Insufficient documentation

## 2020-04-17 DIAGNOSIS — R071 Chest pain on breathing: Secondary | ICD-10-CM | POA: Diagnosis not present

## 2020-04-17 DIAGNOSIS — S20219A Contusion of unspecified front wall of thorax, initial encounter: Secondary | ICD-10-CM | POA: Diagnosis not present

## 2020-04-17 DIAGNOSIS — Y9389 Activity, other specified: Secondary | ICD-10-CM | POA: Insufficient documentation

## 2020-04-17 DIAGNOSIS — W01190A Fall on same level from slipping, tripping and stumbling with subsequent striking against furniture, initial encounter: Secondary | ICD-10-CM | POA: Diagnosis not present

## 2020-04-17 DIAGNOSIS — R0781 Pleurodynia: Secondary | ICD-10-CM

## 2020-04-17 DIAGNOSIS — S299XXA Unspecified injury of thorax, initial encounter: Secondary | ICD-10-CM | POA: Diagnosis present

## 2020-04-17 DIAGNOSIS — Z79899 Other long term (current) drug therapy: Secondary | ICD-10-CM | POA: Diagnosis not present

## 2020-04-17 MED ORDER — HYDROCODONE-ACETAMINOPHEN 5-325 MG PO TABS
1.0000 | ORAL_TABLET | Freq: Once | ORAL | Status: AC
  Administered 2020-04-17: 16:00:00 1 via ORAL
  Filled 2020-04-17: qty 1

## 2020-04-17 MED ORDER — NAPROXEN 500 MG PO TABS: 500.0000 mg | ORAL_TABLET | Freq: Two times a day (BID) | ORAL | 0 refills | Status: DC

## 2020-04-17 MED ORDER — HYDROCODONE-ACETAMINOPHEN 5-325 MG PO TABS: 1.0000 | ORAL_TABLET | Freq: Four times a day (QID) | ORAL | 0 refills | Status: DC | PRN

## 2020-04-17 NOTE — Discharge Instructions (Signed)
Follow-up with your primary care provider if any continued problems or need for continued pain medication.  Ice to your chest and ribs as needed for discomfort.  Take Norco every 6 hours as needed for moderate pain.  Do not drive or operate machinery while taking this medication.  Naproxen 500 mg twice daily for inflammation.  Take this medication with food.

## 2020-04-17 NOTE — ED Triage Notes (Signed)
Pt to ED via [facility] for chief complaint of fall this morning when she slipped and hit her upper left chest on a metal frame. C/o upper left chest pain that worsens with inspiration .  Pt alert and oriented. Able to ambulate from EMS stretcher.

## 2020-04-17 NOTE — ED Provider Notes (Signed)
[facility] Department Provider Note  ____________________________________________   First MD Initiated Contact with Patient 04/17/20 1352     (approximate)  I have reviewed the triage vital signs and the nursing notes.   HISTORY  Chief Complaint Fall   HPI Erica Henderson is a 29 y.o. female presents to the ED via EMS with complaint of falling this morning and hitting the upper left portion of her chest on the floor and metal frame of her bed.  Patient denies any head injury or loss of consciousness.  Patient states that pain is worse with deep breathing.  Patient rates her pain as a 10/10.      Past Medical History:  Diagnosis Date  . Depression     There are no problems to display for this patient.   Past Surgical History:  Procedure Laterality Date  . CESAREAN SECTION    . SHOULDER ARTHROSCOPY Right   . TUBAL LIGATION      Prior to Admission medications   Medication Sig Start Date End Date Taking? Authorizing Provider  cetirizine (ZYRTEC) 10 MG tablet Take 1 tablet (10 mg total) by mouth daily. 05/20/18   Hinda Kehr, MD  fluticasone (VERAMYST) 27.5 MCG/SPRAY nasal spray Place 2 sprays into each nostril daily. Patient not taking: Reported on 01/14/2020 05/20/18   Hinda Kehr, MD  HYDROcodone-acetaminophen (NORCO/VICODIN) 5-325 MG tablet Take 1 tablet by mouth every 6 (six) hours as needed for moderate pain. 04/17/20   Johnn Hai, PA-C  naproxen (NAPROSYN) 500 MG tablet Take 1 tablet (500 mg total) by mouth 2 (two) times daily with a meal. 04/17/20 04/17/21  Letitia Neri L, PA-C  potassium chloride SA (KLOR-CON M20) 20 MEQ tablet Take 1 tablet (20 mEq total) by mouth daily. Patient not taking: Reported on 01/14/2020 05/20/18   Hinda Kehr, MD  sertraline (ZOLOFT) 100 MG tablet Take 100 mg by mouth daily. 10/31/19   [provider]  trimethoprim-polymyxin b (POLYTRIM) ophthalmic solution Put 1 drop in affected  eye(s) every 4 hours for 1 week Patient not taking: Reported on 01/14/2020 05/20/18   Hinda Kehr, MD    Allergies Patient has no known allergies.  No family history on file.  Social History Social History   Tobacco Use  . Smoking status: Former Research scientist (life sciences)  . Smokeless tobacco: Never Used  Substance Use Topics  . Alcohol use: Yes    Comment: socially  . Drug use: Yes    Types: Marijuana    Comment: often    Review of Systems Constitutional: No fever/chills Eyes: No visual changes. ENT: No trauma. Cardiovascular: Denies chest pain. Respiratory: Denies shortness of breath.  Positive anterior chest wall pain and left rib pain. Gastrointestinal: No abdominal pain.  No nausea, no vomiting.  Musculoskeletal: Negative for other muscle skeletal complaints. Skin: Negative for rash. Neurological: Negative for headaches, focal weakness or numbness. ____________________________________________   PHYSICAL EXAM:  VITAL SIGNS: ED Triage Vitals  Enc Vitals Group     BP 04/17/20 1349 124/78     Pulse Rate 04/17/20 1349 (!) 101     Resp 04/17/20 1349 18     Temp 04/17/20 1348 98.6 F (37 C)     Temp Source 04/17/20 1348 Oral     SpO2 04/17/20 1349 97 %     Weight 04/17/20 1350 147 lb 11.3 oz (67 kg)     Height 04/17/20 1350 5\' 7"  (1.702 m)     Head Circumference --  Peak Flow --      Pain Score 04/17/20 1350 10     Pain Loc --      Pain Edu? --      Excl. in GC? --     Constitutional: Alert and oriented. Well appearing and in no acute distress. Eyes: Conjunctivae are normal.  Head: Atraumatic. Nose: No deformity. Neck: No stridor.  No cervical tenderness on palpation posteriorly. Cardiovascular: Normal rate, regular rhythm. Grossly normal heart sounds.  Good peripheral circulation. Respiratory: Normal respiratory effort.  No retractions. Lungs CTAB.  Markedly tender on palpation of left ribs without gross deformity.  Anterior chest wall tenderness. Gastrointestinal: Soft  and nontender. No distention.  Musculoskeletal: Moves upper and lower extremities without any difficulty.  Normal gait was noted. Neurologic:  Normal speech and language. No gross focal neurologic deficits are appreciated. No gait instability. Skin:  Skin is warm, dry and intact. No rash noted. Psychiatric: Mood and affect are normal. Speech and behavior are normal.  ____________________________________________   LABS (all labs ordered are listed, but only abnormal results are displayed)  Labs Reviewed - No data to display   EKG Sinus tachycardia with a ventricular rate of 102.  PR interval 136, QRS duration 80.   RADIOLOGY   Official radiology report(s): DG Ribs Unilateral W/Chest Left  Result Date: 04/17/2020 CLINICAL DATA:  Pain status post fall EXAM: LEFT RIBS AND CHEST - 3+ VIEW COMPARISON:  None. FINDINGS: No fracture or other bone lesions are seen involving the ribs. There is no evidence of pneumothorax or pleural effusion. Both lungs are clear. Heart size and mediastinal contours are within normal limits. IMPRESSION: Negative. Electronically Signed   By: Katherine Mantle M.D.   On: 04/17/2020 15:21    ____________________________________________   PROCEDURES  Procedure(s) performed (including Critical Care):  Procedures   ____________________________________________   INITIAL IMPRESSION / ASSESSMENT AND PLAN / ED COURSE  As part of my medical decision making, I reviewed the following data within the electronic MEDICAL RECORD NUMBER Notes from prior ED visits and [place] Controlled Substance Database   29 year old female presents to the ED with complaint of anterior chest wall and left rib pain when she fell this morning.  She denies any head injury or loss of consciousness.  Chest x-ray with left rib detail was negative for any acute fracture.  Patient was made aware.  A Norco tablet was given to her while in the ED.  She is encouraged to use ice to her chest and ribs as  needed and to follow-up with her PCP if any continued problems.  A prescription for both Norco and naproxen was sent to her pharmacy.  ____________________________________________   FINAL CLINICAL IMPRESSION(S) / ED DIAGNOSES  Final diagnoses:  Contusion of chest wall, unspecified laterality, initial encounter  Rib pain on left side     ED Discharge Orders         Ordered    HYDROcodone-acetaminophen (NORCO/VICODIN) 5-325 MG tablet  Every 6 hours PRN     04/17/20 1537    naproxen (NAPROSYN) 500 MG tablet  2 times daily with meals     04/17/20 1537           Note:  This document was prepared using Dragon voice recognition software and may include unintentional dictation errors.    Tommi Rumps, PA-C 04/17/20 1541    Willy Eddy, MD 04/17/20 [phone]

## 2020-05-30 ENCOUNTER — Telehealth: Payer: Self-pay

## 2020-05-30 NOTE — Telephone Encounter (Signed)
Per Dr Shara Blazing pt may benefit from a dx lap.   DJE is aware. Appt made to discuss her options. Pt appreciative of call.

## 2020-06-08 ENCOUNTER — Encounter: Payer: Self-pay | Admitting: Obstetrics and Gynecology

## 2020-06-08 ENCOUNTER — Other Ambulatory Visit: Payer: Self-pay

## 2020-06-08 ENCOUNTER — Ambulatory Visit (INDEPENDENT_AMBULATORY_CARE_PROVIDER_SITE_OTHER): Payer: Medicaid Other | Admitting: Obstetrics and Gynecology

## 2020-06-08 VITALS — BP 95/67 | HR 103 | Ht 67.0 in | Wt 149.4 lb

## 2020-06-08 DIAGNOSIS — R1012 Left upper quadrant pain: Secondary | ICD-10-CM

## 2020-06-08 NOTE — Progress Notes (Signed)
HPI:      [name] is a 29 y.o. [id number] who LMP was Patient's last menstrual period was 05/17/2020 (exact date).  Subjective:   She presents today to discuss her abdominal pain and review findings after her consult with Dr. Tollie Henderson.  Patient states that her pain is now worse above into the left of her umbilicus especially when she lies down at night.  She still has the mass below and to the left of her umbilicus. Of significant note I have spoke with Dr. Tollie Henderson regarding his findings and he recommends exploratory laparoscopic surgery and he would like to coordinate our schedules so that we can both be present should this be a GYN versus a general surgery finding.    Hx: The following portions of the patient's history were reviewed and updated as appropriate:             She  has a past medical history of Depression. She does not have a problem list on file. She  has a past surgical history that includes Tubal ligation; Cesarean section; and Shoulder arthroscopy (Right). Her family history includes Diabetes in her father. She  reports that she has quit smoking. She has never used smokeless tobacco. She reports current alcohol use. She reports current drug use. Drug: Marijuana. She has a current medication list which includes the following prescription(s): hydrocodone-acetaminophen and sertraline. She has No Known Allergies.       Review of Systems:  Review of Systems  Constitutional: Denied constitutional symptoms, night sweats, recent illness, fatigue, fever, insomnia and weight loss.  Eyes: Denied eye symptoms, eye pain, photophobia, vision change and visual disturbance.  Ears/Nose/Throat/Neck: Denied ear, nose, throat or neck symptoms, hearing loss, nasal discharge, sinus congestion and sore throat.  Cardiovascular: Denied cardiovascular symptoms, arrhythmia, chest pain/pressure, edema, exercise intolerance, orthopnea and palpitations.  Respiratory: Denied pulmonary  symptoms, asthma, pleuritic pain, productive sputum, cough, dyspnea and wheezing.  Gastrointestinal: Denied, gastro-esophageal reflux, melena, nausea and vomiting.  Genitourinary: See HPI for additional information.  Musculoskeletal: Denied musculoskeletal symptoms, stiffness, swelling, muscle weakness and myalgia.  Dermatologic: Denied dermatology symptoms, rash and scar.  Neurologic: Denied neurology symptoms, dizziness, headache, neck pain and syncope.  Psychiatric: Denied psychiatric symptoms, anxiety and depression.  Endocrine: Denied endocrine symptoms including hot flashes and night sweats.   Meds:   Current Outpatient Medications on File Prior to Visit  Medication Sig Dispense Refill  . HYDROcodone-acetaminophen (NORCO/VICODIN) 5-325 MG tablet Take 1 tablet by mouth every 6 (six) hours as needed for moderate pain. 15 tablet 0  . sertraline (ZOLOFT) 100 MG tablet Take 100 mg by mouth daily.     No current facility-administered medications on file prior to visit.    Objective:     Vitals:   06/08/20 1109  BP: 95/67  Pulse: (!) 103                Assessment:    G2P1102 There are no problems to display for this patient.    1. Left upper quadrant abdominal pain     Patient states that her pain is just as bad as ever and she is also experiencing pain now in the left upper quadrant.  She has consulted with Dr. Tollie Henderson and is very enthusiastic about exploratory laparoscopic surgery.   Plan:            1.  We will schedule preop and coordinate schedule with Dr. Tollie Henderson for exploratory laparoscopic surgery.  All questions answered.  Orders No orders of the defined types were placed in this encounter.   No orders of the defined types were placed in this encounter.     F/U  No follow-ups on file. I spent 13 minutes involved in the care of this patient preparing to see the patient by obtaining and reviewing her medical history (including labs, imaging tests and prior  procedures), documenting clinical information in the electronic health record (EHR), counseling and coordinating care plans, writing and sending prescriptions, ordering tests or procedures and directly communicating with the patient by discussing pertinent items from her history and physical exam as well as detailing my assessment and plan as noted above so that she has an informed understanding.  All of her questions were answered.  Elonda Husky, M.D. 06/08/2020 11:27 AM

## 2020-08-11 ENCOUNTER — Telehealth: Payer: Self-pay | Admitting: Obstetrics and Gynecology

## 2020-08-11 NOTE — Telephone Encounter (Signed)
Please advise. This is the patient that seen you and Dr. Lemar Livings.

## 2020-08-11 NOTE — Telephone Encounter (Signed)
I have scheduled patient to come in for pre op appointment next week.

## 2020-08-11 NOTE — Telephone Encounter (Signed)
Pt called in and is requesting a call back. The pt stated that she was seen 06/08/2020 for a F/u abd pain. The pt stated that she needs to talk about the next step. I told the pt I would send a message to the nurse and to please allow 24-48 hours for a reply. The pt verbally understood. Please advise

## 2020-08-18 ENCOUNTER — Encounter: Payer: Self-pay | Admitting: Obstetrics and Gynecology

## 2020-08-18 ENCOUNTER — Ambulatory Visit (INDEPENDENT_AMBULATORY_CARE_PROVIDER_SITE_OTHER): Payer: Medicaid Other | Admitting: Obstetrics and Gynecology

## 2020-08-18 ENCOUNTER — Other Ambulatory Visit: Payer: Self-pay

## 2020-08-18 VITALS — BP 116/84 | HR 84 | Ht 67.0 in | Wt 149.0 lb

## 2020-08-18 DIAGNOSIS — R102 Pelvic and perineal pain: Secondary | ICD-10-CM

## 2020-08-18 DIAGNOSIS — R109 Unspecified abdominal pain: Secondary | ICD-10-CM

## 2020-08-18 DIAGNOSIS — Z01818 Encounter for other preprocedural examination: Secondary | ICD-10-CM | POA: Diagnosis not present

## 2020-08-18 NOTE — Progress Notes (Signed)
     PRE-OPERATIVE HISTORY AND PHYSICAL EXAM  PCP:  [place], [facility] Subjective:   HPI:  Erica Henderson is a 29 y.o. G2P1102.  Patient's last menstrual period was 07/22/2020.  She presents today for a pre-op discussion and PE.  She has the following symptoms: Worsening abdominal pain.  She describes the pain becoming significantly worse with her menses.  She says just touching her stomach with her fingers because of the pain.  Pain began a few months after her last cesarean delivery. Of significant note patient has a tubal ligation for birth control. Patient has had a consult with Dr. Burnett (general surgery)  Review of Systems:   Constitutional: Denied constitutional symptoms, night sweats, recent illness, fatigue, fever, insomnia and weight loss.  Eyes: Denied eye symptoms, eye pain, photophobia, vision change and visual disturbance.  Ears/Nose/Throat/Neck: Denied ear, nose, throat or neck symptoms, hearing loss, nasal discharge, sinus congestion and sore throat.  Cardiovascular: Denied cardiovascular symptoms, arrhythmia, chest pain/pressure, edema, exercise intolerance, orthopnea and palpitations.  Respiratory: Denied pulmonary symptoms, asthma, pleuritic pain, productive sputum, cough, dyspnea and wheezing.  Gastrointestinal: Denied, gastro-esophageal reflux, melena, nausea and vomiting.  Genitourinary: Denied genitourinary symptoms including symptomatic vaginal discharge, pelvic relaxation issues, and urinary complaints.  Musculoskeletal: Denied musculoskeletal symptoms, stiffness, swelling, muscle weakness and myalgia.  Dermatologic: Denied dermatology symptoms, rash and scar.  Neurologic: Denied neurology symptoms, dizziness, headache, neck pain and syncope.  Psychiatric: Denied psychiatric symptoms, anxiety and depression.  Endocrine: Denied endocrine symptoms including hot flashes and night sweats.   OB History  Gravida Para Term Preterm AB Living  2 2 1 1    2  SAB TAB Ectopic Multiple Live Births          2    # Outcome Date GA Lbr Len/2nd Weight Sex Delivery Anes PTL Lv  2 Term 2016    M CS-LTranv   LIV  1 Preterm 2012    F CS-LTranv   LIV    Past Medical History:  Diagnosis Date  . Depression     Past Surgical History:  Procedure Laterality Date  . CESAREAN SECTION    . SHOULDER ARTHROSCOPY Right   . TUBAL LIGATION        SOCIAL HISTORY: Social History   Tobacco Use  Smoking Status Former Smoker  Smokeless Tobacco Never Used   Social History   Substance and Sexual Activity  Alcohol Use Yes   Comment: socially   Social History   Substance and Sexual Activity  Drug Use Yes  . Types: Marijuana   Comment: daily    Family History  Problem Relation Age of Onset  . Diabetes Father   . Breast cancer Neg Hx   . Ovarian cancer Neg Hx     ALLERGIES:  Patient has no known allergies.  MEDS:   Current Outpatient Medications on File Prior to Visit  Medication Sig Dispense Refill  . HYDROcodone-acetaminophen (NORCO/VICODIN) 5-325 MG tablet Take 1 tablet by mouth every 6 (six) hours as needed for moderate pain. 15 tablet 0  . sertraline (ZOLOFT) 100 MG tablet Take 100 mg by mouth daily.     No current facility-administered medications on file prior to visit.    No orders of the defined types were placed in this encounter.    Physical examination BP 116/84   Pulse 84   Ht 5' 7" (1.702 m)   Wt 149 lb (67.6 kg)   LMP 07/22/2020   BMI 23.34 kg/m     General NAD, Conversant  HEENT Atraumatic; Op clear with mmm.  Normo-cephalic. Pupils reactive. Anicteric sclerae  Thyroid/Neck Smooth without nodularity or enlargement. Normal ROM.  Neck Supple.  Skin No rashes, lesions or ulceration. Normal palpated skin turgor. No nodularity.  Breasts: No masses or discharge.  Symmetric.  No axillary adenopathy.  Lungs: Clear to auscultation.No rales or wheezes. Normal Respiratory effort, no retractions.  Heart: NSR.  No murmurs  or rubs appreciated. No periferal edema  Abdomen: Soft.  No HSM.  There is a midline mass inferior to the umbilicus and 1 cm left of midline.  Mass approximately 2 cm in diameter.  Very tender to palpation.  Masses not change with Valsalva maneuver.  Firm.  Extremities: Moves all appropriately.  Normal ROM for age. No lymphadenopathy.  Neuro: Oriented to PPT.  Normal mood. Normal affect.     Pelvic:   Vulva: Normal appearance.  No lesions.  Vagina: No lesions or abnormalities noted.  Support: Normal pelvic support.  Urethra No masses tenderness or scarring.  Meatus Normal size without lesions or prolapse.  Cervix: Normal ectropion.  No lesions.  Anus: Normal exam.  No lesions.  Perineum: Normal exam.  No lesions.        Bimanual   Uterus: Normal size.  Non-tender.  Mobile.  AV.  Adnexae: No masses.  Non-tender to palpation.  Cul-de-sac: Negative for abnormality.   Assessment:   G2P1102 There are no problems to display for this patient.   1. Abdominal pain, unspecified abdominal location   2. Pelvic pain in female     Abdominal midline mass causing pain.  In addition patient has a cyclic pain with her menses making the diagnosis of endometriosis more likely.  Midline mass may be scar tissue from cesarean infiltrated with endometrial tissue.   Plan:   Orders:  No orders of the defined types were placed in this encounter.    1.  Exploratory laparoscopy 2.  Possible mini laparotomy/exploration of abdominal wall.  Pre-op discussions regarding Risks and Benefits of her scheduled surgery.  LPY for Pain We have discussed the procedure of Exploratory Laparoscopy in detail.  Medical management of pelvic pain has also been discussed and the patient has decided that this option is not satisfactory for her.  She has elected to have a Laparoscopy performed to attempt to diagnose and manage her pain.  I have informed her that Laparoscopy, like other surgical procedures, entails the  following risks:  bleeding, infection, damage to bowel, bladder or other internal organ, and the risk or anesthesia.  She is aware that her risks are not limited to these.  We have discussed the possibility of Endometriosis and Pelvic Adhesive Disease.  We have discussed surgical as well as subsequent medical management of these conditions.  The patient is aware that both of these conditions can often be diagnosed and treated via Laparoscopy.  We have discussed the possibility  of recurrence and that despite adequate treatment at this time, pelvic pain may reoccur in the future.  We have discussed the possibility that Laparoscopy may not diagnose or cure her pain.  It is possible that no diagnosis will be made in up to 50% of cases.  I have answered all of her questions and I believe she has been well informed regarding the risks/benefits of Exploratory Laparoscopy for pelvic pain. We have also discussed possible oophorectomy or salpingectomy if pathology dictates.  Patient understands this is a possibility. In addition abdominal wall incision and exploration over the  area of the mass has also been discussed.  I spent 36 minutes involved in the care of this patient preparing to see the patient by obtaining and reviewing her medical history (including labs, imaging tests and prior procedures), documenting clinical information in the electronic health record (EHR), counseling and coordinating care plans, writing and sending prescriptions, ordering tests or procedures and directly communicating with the patient by discussing pertinent items from her history and physical exam as well as detailing my assessment and plan as noted above so that she has an informed understanding.  All of her questions were answered.   Elonda Husky, M.D. 08/18/2020 8:44 AM

## 2020-08-18 NOTE — H&P (View-Only) (Signed)
     PRE-OPERATIVE HISTORY AND PHYSICAL EXAM  PCP:  [place], [facility] Subjective:   HPI:  Erica Henderson is a 29 y.o. G2P1102.  Patient's last menstrual period was 07/22/2020.  She presents today for a pre-op discussion and PE.  She has the following symptoms: Worsening abdominal pain.  She describes the pain becoming significantly worse with her menses.  She says just touching her stomach with her fingers because of the pain.  Pain began a few months after her last cesarean delivery. Of significant note patient has a tubal ligation for birth control. Patient has had a consult with Dr. Burnett (general surgery)  Review of Systems:   Constitutional: Denied constitutional symptoms, night sweats, recent illness, fatigue, fever, insomnia and weight loss.  Eyes: Denied eye symptoms, eye pain, photophobia, vision change and visual disturbance.  Ears/Nose/Throat/Neck: Denied ear, nose, throat or neck symptoms, hearing loss, nasal discharge, sinus congestion and sore throat.  Cardiovascular: Denied cardiovascular symptoms, arrhythmia, chest pain/pressure, edema, exercise intolerance, orthopnea and palpitations.  Respiratory: Denied pulmonary symptoms, asthma, pleuritic pain, productive sputum, cough, dyspnea and wheezing.  Gastrointestinal: Denied, gastro-esophageal reflux, melena, nausea and vomiting.  Genitourinary: Denied genitourinary symptoms including symptomatic vaginal discharge, pelvic relaxation issues, and urinary complaints.  Musculoskeletal: Denied musculoskeletal symptoms, stiffness, swelling, muscle weakness and myalgia.  Dermatologic: Denied dermatology symptoms, rash and scar.  Neurologic: Denied neurology symptoms, dizziness, headache, neck pain and syncope.  Psychiatric: Denied psychiatric symptoms, anxiety and depression.  Endocrine: Denied endocrine symptoms including hot flashes and night sweats.   OB History  Gravida Para Term Preterm AB Living  2 2 1 1    2  SAB TAB Ectopic Multiple Live Births          2    # Outcome Date GA Lbr Len/2nd Weight Sex Delivery Anes PTL Lv  2 Term 2016    M CS-LTranv   LIV  1 Preterm 2012    F CS-LTranv   LIV    Past Medical History:  Diagnosis Date  . Depression     Past Surgical History:  Procedure Laterality Date  . CESAREAN SECTION    . SHOULDER ARTHROSCOPY Right   . TUBAL LIGATION        SOCIAL HISTORY: Social History   Tobacco Use  Smoking Status Former Smoker  Smokeless Tobacco Never Used   Social History   Substance and Sexual Activity  Alcohol Use Yes   Comment: socially   Social History   Substance and Sexual Activity  Drug Use Yes  . Types: Marijuana   Comment: daily    Family History  Problem Relation Age of Onset  . Diabetes Father   . Breast cancer Neg Hx   . Ovarian cancer Neg Hx     ALLERGIES:  Patient has no known allergies.  MEDS:   Current Outpatient Medications on File Prior to Visit  Medication Sig Dispense Refill  . HYDROcodone-acetaminophen (NORCO/VICODIN) 5-325 MG tablet Take 1 tablet by mouth every 6 (six) hours as needed for moderate pain. 15 tablet 0  . sertraline (ZOLOFT) 100 MG tablet Take 100 mg by mouth daily.     No current facility-administered medications on file prior to visit.    No orders of the defined types were placed in this encounter.    Physical examination BP 116/84   Pulse 84   Ht 5' 7" (1.702 m)   Wt 149 lb (67.6 kg)   LMP 07/22/2020   BMI 23.34 kg/m     General NAD, Conversant  HEENT Atraumatic; Op clear with mmm.  Normo-cephalic. Pupils reactive. Anicteric sclerae  Thyroid/Neck Smooth without nodularity or enlargement. Normal ROM.  Neck Supple.  Skin No rashes, lesions or ulceration. Normal palpated skin turgor. No nodularity.  Breasts: No masses or discharge.  Symmetric.  No axillary adenopathy.  Lungs: Clear to auscultation.No rales or wheezes. Normal Respiratory effort, no retractions.  Heart: NSR.  No murmurs  or rubs appreciated. No periferal edema  Abdomen: Soft.  No HSM.  There is a midline mass inferior to the umbilicus and 1 cm left of midline.  Mass approximately 2 cm in diameter.  Very tender to palpation.  Masses not change with Valsalva maneuver.  Firm.  Extremities: Moves all appropriately.  Normal ROM for age. No lymphadenopathy.  Neuro: Oriented to PPT.  Normal mood. Normal affect.     Pelvic:   Vulva: Normal appearance.  No lesions.  Vagina: No lesions or abnormalities noted.  Support: Normal pelvic support.  Urethra No masses tenderness or scarring.  Meatus Normal size without lesions or prolapse.  Cervix: Normal ectropion.  No lesions.  Anus: Normal exam.  No lesions.  Perineum: Normal exam.  No lesions.        Bimanual   Uterus: Normal size.  Non-tender.  Mobile.  AV.  Adnexae: No masses.  Non-tender to palpation.  Cul-de-sac: Negative for abnormality.   Assessment:   G2P1102 There are no problems to display for this patient.   1. Abdominal pain, unspecified abdominal location   2. Pelvic pain in female     Abdominal midline mass causing pain.  In addition patient has a cyclic pain with her menses making the diagnosis of endometriosis more likely.  Midline mass may be scar tissue from cesarean infiltrated with endometrial tissue.   Plan:   Orders: No orders of the defined types were placed in this encounter.    1.  Exploratory laparoscopy 2.  Possible mini laparotomy/exploration of abdominal wall.   Elonda Husky, M.D. 08/18/2020 8:44 AM

## 2020-08-18 NOTE — H&P (Signed)
PRE-OPERATIVE HISTORY AND PHYSICAL EXAM  PCP:  [place], [facility] Subjective:   HPI:  Erica Henderson is a 29 y.o. [id number].  Patient's last menstrual period was 07/22/2020.  She presents today for a pre-op discussion and PE.  She has the following symptoms: Worsening abdominal pain.  She describes the pain becoming significantly worse with her menses.  She says just touching her stomach with her fingers because of the pain.  Pain began a few months after her last cesarean delivery. Of significant note patient has a tubal ligation for birth control. Patient has had a consult with Dr. Doristine Counter (general surgery)  Review of Systems:   Constitutional: Denied constitutional symptoms, night sweats, recent illness, fatigue, fever, insomnia and weight loss.  Eyes: Denied eye symptoms, eye pain, photophobia, vision change and visual disturbance.  Ears/Nose/Throat/Neck: Denied ear, nose, throat or neck symptoms, hearing loss, nasal discharge, sinus congestion and sore throat.  Cardiovascular: Denied cardiovascular symptoms, arrhythmia, chest pain/pressure, edema, exercise intolerance, orthopnea and palpitations.  Respiratory: Denied pulmonary symptoms, asthma, pleuritic pain, productive sputum, cough, dyspnea and wheezing.  Gastrointestinal: Denied, gastro-esophageal reflux, melena, nausea and vomiting.  Genitourinary: Denied genitourinary symptoms including symptomatic vaginal discharge, pelvic relaxation issues, and urinary complaints.  Musculoskeletal: Denied musculoskeletal symptoms, stiffness, swelling, muscle weakness and myalgia.  Dermatologic: Denied dermatology symptoms, rash and scar.  Neurologic: Denied neurology symptoms, dizziness, headache, neck pain and syncope.  Psychiatric: Denied psychiatric symptoms, anxiety and depression.  Endocrine: Denied endocrine symptoms including hot flashes and night sweats.   OB History  Gravida Para Term Preterm AB Living  2 2 1 1    2   SAB TAB Ectopic Multiple Live Births          2    # Outcome Date GA Lbr Len/2nd Weight Sex Delivery Anes PTL Lv  2 Term 2016    M CS-LTranv   LIV  1 Preterm 2012    F CS-LTranv   LIV    Past Medical History:  Diagnosis Date  . Depression     Past Surgical History:  Procedure Laterality Date  . CESAREAN SECTION    . SHOULDER ARTHROSCOPY Right   . TUBAL LIGATION        SOCIAL HISTORY: Social History   Tobacco Use  Smoking Status Former Smoker  Smokeless Tobacco Never Used   Social History   Substance and Sexual Activity  Alcohol Use Yes   Comment: socially   Social History   Substance and Sexual Activity  Drug Use Yes  . Types: Marijuana   Comment: daily    Family History  Problem Relation Age of Onset  . Diabetes Father   . Breast cancer Neg Hx   . Ovarian cancer Neg Hx     ALLERGIES:  Patient has no known allergies.  MEDS:   Current Outpatient Medications on File Prior to Visit  Medication Sig Dispense Refill  . HYDROcodone-acetaminophen (NORCO/VICODIN) 5-325 MG tablet Take 1 tablet by mouth every 6 (six) hours as needed for moderate pain. 15 tablet 0  . sertraline (ZOLOFT) 100 MG tablet Take 100 mg by mouth daily.     No current facility-administered medications on file prior to visit.    No orders of the defined types were placed in this encounter.    Physical examination BP 116/84   Pulse 84   Ht 5\' 7"  (1.702 m)   Wt 149 lb (67.6 kg)   LMP 07/22/2020   BMI 23.34 kg/m  General NAD, Conversant  HEENT Atraumatic; Op clear with mmm.  Normo-cephalic. Pupils reactive. Anicteric sclerae  Thyroid/Neck Smooth without nodularity or enlargement. Normal ROM.  Neck Supple.  Skin No rashes, lesions or ulceration. Normal palpated skin turgor. No nodularity.  Breasts: No masses or discharge.  Symmetric.  No axillary adenopathy.  Lungs: Clear to auscultation.No rales or wheezes. Normal Respiratory effort, no retractions.  Heart: NSR.  No murmurs  or rubs appreciated. No periferal edema  Abdomen: Soft.  No HSM.  There is a midline mass inferior to the umbilicus and 1 cm left of midline.  Mass approximately 2 cm in diameter.  Very tender to palpation.  Masses not change with Valsalva maneuver.  Firm.  Extremities: Moves all appropriately.  Normal ROM for age. No lymphadenopathy.  Neuro: Oriented to PPT.  Normal mood. Normal affect.     Pelvic:   Vulva: Normal appearance.  No lesions.  Vagina: No lesions or abnormalities noted.  Support: Normal pelvic support.  Urethra No masses tenderness or scarring.  Meatus Normal size without lesions or prolapse.  Cervix: Normal ectropion.  No lesions.  Anus: Normal exam.  No lesions.  Perineum: Normal exam.  No lesions.        Bimanual   Uterus: Normal size.  Non-tender.  Mobile.  AV.  Adnexae: No masses.  Non-tender to palpation.  Cul-de-sac: Negative for abnormality.   Assessment:   G2P1102 There are no problems to display for this patient.   1. Abdominal pain, unspecified abdominal location   2. Pelvic pain in female     Abdominal midline mass causing pain.  In addition patient has a cyclic pain with her menses making the diagnosis of endometriosis more likely.  Midline mass may be scar tissue from cesarean infiltrated with endometrial tissue.   Plan:   Orders: No orders of the defined types were placed in this encounter.    1.  Exploratory laparoscopy 2.  Possible mini laparotomy/exploration of abdominal wall.   Elonda Husky, M.D. 08/18/2020 8:44 AM

## 2020-09-01 ENCOUNTER — Other Ambulatory Visit: Payer: Self-pay

## 2020-09-01 ENCOUNTER — Other Ambulatory Visit
Admission: RE | Admit: 2020-09-01 | Discharge: 2020-09-01 | Disposition: A | Discharge status: Home / Self Care | Payer: Medicaid Other | Source: Ambulatory Visit | Attending: Obstetrics and Gynecology | Admitting: Obstetrics and Gynecology

## 2020-09-01 NOTE — Patient Instructions (Signed)
Your procedure is scheduled on: September 08, 2020 Friday  Report to Day Surgery on the 2nd floor of the [facility]. To find out your arrival time, please call [phone] between 1PM - 3PM on: Thursday September 07, 2020  REMEMBER: Instructions that are not followed completely may result in serious medical risk, up to and including death; or upon the discretion of your surgeon and anesthesiologist your surgery may need to be rescheduled.  Do not eat food after midnight the night before surgery.  No gum chewing, lozengers or hard candies.  You may however, drink CLEAR liquids up to 2 hours before you are scheduled to arrive for your surgery. Do not drink anything within 2 hours of your scheduled arrival time.  Clear liquids include: - water  - apple juice without pulp - gatorade (not RED) - black coffee or tea (Do NOT add milk or creamers to the coffee or tea) Do NOT drink anything that is not on this list.  Type 1 and Type 2 diabetics should only drink water.  In addition, your doctor has ordered for you to drink the provided  Ensure Pre-Surgery Clear Carbohydrate Drink  Drinking this carbohydrate drink up to two hours before surgery helps to reduce insulin resistance and improve patient outcomes. Please complete drinking 2 hours prior to scheduled arrival time.  TAKE THESE MEDICATIONS THE MORNING OF SURGERY WITH A SIP OF WATER: SERTRALINE  One week prior to surgery: Stop Anti-inflammatories (NSAIDS) such as Advil, Aleve, Ibuprofen, Motrin, Naproxen, Naprosyn and ASPIRIN OR Aspirin based products such as Excedrin, Goodys Powder, BC Powder. Stop ANY OVER THE COUNTER supplements until after surgery. (You may continue taking Tylenol, Vitamin D, Vitamin B, and multivitamin.)  No Alcohol for 24 hours before or after surgery.  No Smoking including e-cigarettes for 24 hours prior to surgery.  No chewable tobacco products for at least 6 hours prior to surgery.  No nicotine patches  on the day of surgery.  Do not use any "recreational" drugs for at least a week prior to your surgery.  Please be advised that the combination of cocaine and anesthesia may have negative outcomes, up to and including death. If you test positive for cocaine, your surgery will be cancelled.  On the morning of surgery brush your teeth with toothpaste and water, you may rinse your mouth with mouthwash if you wish. Do not swallow any toothpaste or mouthwash.  Do not wear jewelry, make-up, hairpins, clips or nail polish.  Do not wear lotions, powders, or perfumes AND NO DEODORANT    Do not shave 48 hours prior to surgery.   Contact lenses, hearing aids and dentures may not be worn into surgery.  Do not bring valuables to the hospital. [facility] is not responsible for any missing/lost belongings or valuables.   Use CHG Soap  as directed on instruction sheet.  Notify your doctor if there is any change in your medical condition (cold, fever, infection).  Wear comfortable clothing (specific to your surgery type) to the hospital.  Plan for stool softeners for home use; pain medications have a tendency to cause constipation. You can also help prevent constipation by eating foods high in fiber such as fruits and vegetables and drinking plenty of fluids as your diet allows.  After surgery, you can help prevent lung complications by doing breathing exercises.  Take deep breaths and cough every 1-2 hours. Your doctor may order a device called an Incentive Spirometer to help you take deep breaths. When  coughing or sneezing, hold a pillow firmly against your incision with both hands. This is called "splinting." Doing this helps protect your incision. It also decreases belly discomfort.   If you are being discharged the day of surgery, you will not be allowed to drive home. You will need a responsible adult (18 years or older) to drive you home and stay with you that night.   Please call the  Pre-admissions Testing Dept. at [phone] if you have any questions about these instructions.  Visitation Policy:  Patients undergoing a surgery or procedure may have one family member or support person with them as long as that person is not COVID-19 positive or experiencing its symptoms.  That person may remain in the waiting area during the procedure.  Inpatient Visitation Update:   In an effort to ensure the safety of our team members and our patients, we are implementing a change to our visitation policy:  Effective Monday, Aug. 9, at 7 a.m., inpatients will be allowed one support person.  o The support person may change daily.  o The support person must pass our screening, gel in and out, and wear a mask at all times, including in the patient's room.  o Patients must also wear a mask when staff or their support person are in the room.  o Masking is required regardless of vaccination status.  Systemwide, no visitors 17 or younger.

## 2020-09-06 ENCOUNTER — Other Ambulatory Visit
Admission: RE | Admit: 2020-09-06 | Discharge: 2020-09-06 | Disposition: A | Discharge status: Home / Self Care | Payer: Medicaid Other | Source: Ambulatory Visit | Attending: Obstetrics and Gynecology | Admitting: Obstetrics and Gynecology

## 2020-09-06 ENCOUNTER — Other Ambulatory Visit: Payer: Self-pay

## 2020-09-06 DIAGNOSIS — Z01812 Encounter for preprocedural laboratory examination: Secondary | ICD-10-CM | POA: Insufficient documentation

## 2020-09-06 DIAGNOSIS — Z20822 Contact with and (suspected) exposure to covid-19: Secondary | ICD-10-CM | POA: Diagnosis not present

## 2020-09-06 LAB — CBC
HCT: 39.4 % (ref 36.0–46.0)
Hemoglobin: 13.2 g/dL (ref 12.0–15.0)
MCH: 31.4 pg (ref 26.0–34.0)
MCHC: 33.5 g/dL (ref 30.0–36.0)
MCV: 93.8 fL (ref 80.0–100.0)
Platelets: 214 10*3/uL (ref 150–400)
RBC: 4.2 MIL/uL (ref 3.87–5.11)
RDW: 12.8 % (ref 11.5–15.5)
WBC: 5.3 10*3/uL (ref 4.0–10.5)
nRBC: 0 % (ref 0.0–0.2)

## 2020-09-06 LAB — TYPE AND SCREEN
ABO/RH(D): A POS
Antibody Screen: NEGATIVE

## 2020-09-06 LAB — SARS CORONAVIRUS 2 (TAT 6-24 HRS): SARS Coronavirus 2: NEGATIVE

## 2020-09-08 ENCOUNTER — Ambulatory Visit
Admission: RE | Admit: 2020-09-08 | Discharge: 2020-09-08 | Disposition: A | Discharge status: Home / Self Care | Payer: Medicaid Other | Attending: Obstetrics and Gynecology | Admitting: Obstetrics and Gynecology

## 2020-09-08 ENCOUNTER — Other Ambulatory Visit: Payer: Self-pay

## 2020-09-08 ENCOUNTER — Encounter: Payer: Self-pay | Admitting: Obstetrics and Gynecology

## 2020-09-08 ENCOUNTER — Ambulatory Visit: Payer: Medicaid Other | Admitting: Registered Nurse

## 2020-09-08 ENCOUNTER — Encounter
Admission: RE | Disposition: A | Discharge status: Home / Self Care | Payer: Self-pay | Source: Home / Self Care | Attending: Obstetrics and Gynecology

## 2020-09-08 DIAGNOSIS — K660 Peritoneal adhesions (postprocedural) (postinfection): Secondary | ICD-10-CM | POA: Insufficient documentation

## 2020-09-08 DIAGNOSIS — Z87891 Personal history of nicotine dependence: Secondary | ICD-10-CM | POA: Diagnosis not present

## 2020-09-08 DIAGNOSIS — Z98891 History of uterine scar from previous surgery: Secondary | ICD-10-CM | POA: Diagnosis not present

## 2020-09-08 DIAGNOSIS — F329 Major depressive disorder, single episode, unspecified: Secondary | ICD-10-CM | POA: Diagnosis not present

## 2020-09-08 DIAGNOSIS — Z79899 Other long term (current) drug therapy: Secondary | ICD-10-CM | POA: Diagnosis not present

## 2020-09-08 DIAGNOSIS — R1900 Intra-abdominal and pelvic swelling, mass and lump, unspecified site: Secondary | ICD-10-CM | POA: Diagnosis present

## 2020-09-08 DIAGNOSIS — N808 Other endometriosis: Secondary | ICD-10-CM | POA: Diagnosis not present

## 2020-09-08 HISTORY — PX: LAPAROTOMY: SHX154

## 2020-09-08 HISTORY — PX: LAPAROSCOPY: SHX197

## 2020-09-08 LAB — URINE DRUG SCREEN, QUALITATIVE (ARMC ONLY)
Amphetamines, Ur Screen: NOT DETECTED
Barbiturates, Ur Screen: NOT DETECTED
Benzodiazepine, Ur Scrn: NOT DETECTED
Cannabinoid 50 Ng, Ur ~~LOC~~: POSITIVE — AB
Cocaine Metabolite,Ur ~~LOC~~: NOT DETECTED
MDMA (Ecstasy)Ur Screen: NOT DETECTED
Methadone Scn, Ur: NOT DETECTED
Opiate, Ur Screen: NOT DETECTED
Phencyclidine (PCP) Ur S: NOT DETECTED
Tricyclic, Ur Screen: NOT DETECTED

## 2020-09-08 LAB — ABO/RH: ABO/RH(D): A POS

## 2020-09-08 LAB — POCT PREGNANCY, URINE: Preg Test, Ur: NEGATIVE

## 2020-09-08 SURGERY — LAPAROSCOPY, DIAGNOSTIC
Anesthesia: General

## 2020-09-08 MED ORDER — FENTANYL CITRATE (PF) 100 MCG/2ML IJ SOLN
INTRAMUSCULAR | Status: DC | PRN
Start: 2020-09-08 — End: 2020-09-08
  Administered 2020-09-08: 75 ug via INTRAVENOUS
  Administered 2020-09-08 (×3): 25 ug via INTRAVENOUS
  Administered 2020-09-08: 50 ug via INTRAVENOUS

## 2020-09-08 MED ORDER — ONDANSETRON HCL 4 MG/2ML IJ SOLN: 4.0000 mg | Freq: Once | INTRAMUSCULAR | Status: DC | PRN

## 2020-09-08 MED ORDER — PROPOFOL 500 MG/50ML IV EMUL
INTRAVENOUS | Status: AC
  Filled 2020-09-08: qty 50

## 2020-09-08 MED ORDER — KETOROLAC TROMETHAMINE 30 MG/ML IJ SOLN
INTRAMUSCULAR | Status: AC
  Filled 2020-09-08: qty 1

## 2020-09-08 MED ORDER — OXYCODONE HCL 5 MG PO TABS
ORAL_TABLET | ORAL | Status: AC
  Administered 2020-09-08: 5 mg via ORAL
  Filled 2020-09-08: qty 1

## 2020-09-08 MED ORDER — MIDAZOLAM HCL 2 MG/2ML IJ SOLN
INTRAMUSCULAR | Status: AC
  Filled 2020-09-08: qty 2

## 2020-09-08 MED ORDER — ACETAMINOPHEN 10 MG/ML IV SOLN: 1000.0000 mg | Freq: Once | INTRAVENOUS | Status: DC | PRN

## 2020-09-08 MED ORDER — FENTANYL CITRATE (PF) 100 MCG/2ML IJ SOLN
INTRAMUSCULAR | Status: AC
  Administered 2020-09-08: 25 ug via INTRAVENOUS
  Filled 2020-09-08: qty 2

## 2020-09-08 MED ORDER — OXYCODONE-ACETAMINOPHEN 5-325 MG PO TABS: 1.0000 | ORAL_TABLET | ORAL | Status: DC | PRN

## 2020-09-08 MED ORDER — FAMOTIDINE 20 MG PO TABS: 20.0000 mg | ORAL_TABLET | Freq: Once | ORAL | Status: AC

## 2020-09-08 MED ORDER — ACETAMINOPHEN 10 MG/ML IV SOLN
INTRAVENOUS | Status: AC
  Filled 2020-09-08: qty 100

## 2020-09-08 MED ORDER — ACETAMINOPHEN 10 MG/ML IV SOLN
INTRAVENOUS | Status: DC | PRN
  Administered 2020-09-08: 1000 mg via INTRAVENOUS

## 2020-09-08 MED ORDER — HYDROMORPHONE HCL 1 MG/ML IJ SOLN
0.2500 mg | INTRAMUSCULAR | Status: DC | PRN
  Administered 2020-09-08: 0.5 mg via INTRAVENOUS

## 2020-09-08 MED ORDER — ONDANSETRON HCL 4 MG/2ML IJ SOLN
INTRAMUSCULAR | Status: AC
  Filled 2020-09-08: qty 2

## 2020-09-08 MED ORDER — OXYCODONE HCL 5 MG PO TABS: 5.0000 mg | ORAL_TABLET | Freq: Once | ORAL | Status: DC

## 2020-09-08 MED ORDER — BUPIVACAINE HCL 0.5 % IJ SOLN
INTRAMUSCULAR | Status: DC | PRN
  Administered 2020-09-08: 20 mL

## 2020-09-08 MED ORDER — FENTANYL CITRATE (PF) 100 MCG/2ML IJ SOLN
INTRAMUSCULAR | Status: AC
  Filled 2020-09-08: qty 2

## 2020-09-08 MED ORDER — ONDANSETRON HCL 4 MG/2ML IJ SOLN
INTRAMUSCULAR | Status: DC | PRN
  Administered 2020-09-08: 4 mg via INTRAVENOUS

## 2020-09-08 MED ORDER — DEXAMETHASONE SODIUM PHOSPHATE 10 MG/ML IJ SOLN
INTRAMUSCULAR | Status: AC
  Filled 2020-09-08: qty 1

## 2020-09-08 MED ORDER — ORAL CARE MOUTH RINSE: 15.0000 mL | Freq: Once | OROMUCOSAL | Status: AC

## 2020-09-08 MED ORDER — FAMOTIDINE 20 MG PO TABS
ORAL_TABLET | ORAL | Status: AC
  Administered 2020-09-08: 20 mg via ORAL
  Filled 2020-09-08: qty 1

## 2020-09-08 MED ORDER — SUGAMMADEX SODIUM 200 MG/2ML IV SOLN
INTRAVENOUS | Status: DC | PRN
  Administered 2020-09-08: 200 mg via INTRAVENOUS

## 2020-09-08 MED ORDER — FENTANYL CITRATE (PF) 100 MCG/2ML IJ SOLN
INTRAMUSCULAR | Status: AC
  Administered 2020-09-08: 50 ug via INTRAVENOUS
  Filled 2020-09-08: qty 2

## 2020-09-08 MED ORDER — EPHEDRINE 5 MG/ML INJ
INTRAVENOUS | Status: AC
  Filled 2020-09-08: qty 10

## 2020-09-08 MED ORDER — HYDROMORPHONE HCL 1 MG/ML IJ SOLN
INTRAMUSCULAR | Status: AC
  Administered 2020-09-08: 0.5 mg via INTRAVENOUS
  Filled 2020-09-08: qty 1

## 2020-09-08 MED ORDER — KETOROLAC TROMETHAMINE 30 MG/ML IJ SOLN
INTRAMUSCULAR | Status: DC | PRN
  Administered 2020-09-08: 30 mg via INTRAVENOUS

## 2020-09-08 MED ORDER — BUPIVACAINE HCL (PF) 0.5 % IJ SOLN
INTRAMUSCULAR | Status: AC
  Filled 2020-09-08: qty 30

## 2020-09-08 MED ORDER — LIDOCAINE HCL (CARDIAC) PF 100 MG/5ML IV SOSY
PREFILLED_SYRINGE | INTRAVENOUS | Status: DC | PRN
  Administered 2020-09-08: 80 mg via INTRAVENOUS

## 2020-09-08 MED ORDER — CHLORHEXIDINE GLUCONATE 0.12 % MT SOLN: 15.0000 mL | Freq: Once | OROMUCOSAL | Status: AC

## 2020-09-08 MED ORDER — OXYCODONE HCL 5 MG PO TABS: 5.0000 mg | ORAL_TABLET | Freq: Once | ORAL | Status: AC | PRN

## 2020-09-08 MED ORDER — FENTANYL CITRATE (PF) 100 MCG/2ML IJ SOLN
25.0000 ug | INTRAMUSCULAR | Status: DC | PRN
  Administered 2020-09-08: 50 ug via INTRAVENOUS
  Administered 2020-09-08: 25 ug via INTRAVENOUS

## 2020-09-08 MED ORDER — CHLORHEXIDINE GLUCONATE 0.12 % MT SOLN
OROMUCOSAL | Status: AC
  Administered 2020-09-08: 15 mL via OROMUCOSAL
  Filled 2020-09-08: qty 15

## 2020-09-08 MED ORDER — ONDANSETRON HCL 4 MG/2ML IJ SOLN: 4.0000 mg | Freq: Four times a day (QID) | INTRAMUSCULAR | Status: DC | PRN

## 2020-09-08 MED ORDER — DEXTROSE IN LACTATED RINGERS 5 % IV SOLN: INTRAVENOUS | Status: DC

## 2020-09-08 MED ORDER — PROPOFOL 10 MG/ML IV BOLUS
INTRAVENOUS | Status: AC
  Filled 2020-09-08: qty 20

## 2020-09-08 MED ORDER — MIDAZOLAM HCL 2 MG/2ML IJ SOLN
INTRAMUSCULAR | Status: DC | PRN
  Administered 2020-09-08: 2 mg via INTRAVENOUS

## 2020-09-08 MED ORDER — ROCURONIUM BROMIDE 100 MG/10ML IV SOLN
INTRAVENOUS | Status: DC | PRN
  Administered 2020-09-08: 10 mg via INTRAVENOUS
  Administered 2020-09-08: 50 mg via INTRAVENOUS

## 2020-09-08 MED ORDER — KETOROLAC TROMETHAMINE 30 MG/ML IJ SOLN: 30.0000 mg | Freq: Once | INTRAMUSCULAR | Status: DC

## 2020-09-08 MED ORDER — PROPOFOL 10 MG/ML IV BOLUS
INTRAVENOUS | Status: DC | PRN
  Administered 2020-09-08: 100 ug/kg/min via INTRAVENOUS
  Administered 2020-09-08: 150 mg via INTRAVENOUS
  Administered 2020-09-08: 50 mg via INTRAVENOUS

## 2020-09-08 MED ORDER — OXYCODONE-ACETAMINOPHEN 5-325 MG PO TABS
ORAL_TABLET | ORAL | Status: AC
  Administered 2020-09-08: 1 via ORAL
  Filled 2020-09-08: qty 1

## 2020-09-08 MED ORDER — ONDANSETRON 4 MG PO TBDP: 4.0000 mg | ORAL_TABLET | Freq: Four times a day (QID) | ORAL | Status: DC | PRN

## 2020-09-08 MED ORDER — HYDROMORPHONE HCL 1 MG/ML IJ SOLN
INTRAMUSCULAR | Status: AC
  Filled 2020-09-08: qty 1

## 2020-09-08 MED ORDER — LACTATED RINGERS IV SOLN: INTRAVENOUS | Status: DC

## 2020-09-08 MED ORDER — HYDROCODONE-ACETAMINOPHEN 5-325 MG PO TABS
1.0000 | ORAL_TABLET | Freq: Four times a day (QID) | ORAL | 0 refills | Status: DC | PRN
Start: 2020-09-08 — End: 2022-12-11

## 2020-09-08 MED ORDER — DEXAMETHASONE SODIUM PHOSPHATE 10 MG/ML IJ SOLN
INTRAMUSCULAR | Status: DC | PRN
  Administered 2020-09-08: 10 mg via INTRAVENOUS

## 2020-09-08 MED ORDER — OXYCODONE HCL 5 MG/5ML PO SOLN: 5.0000 mg | Freq: Once | ORAL | Status: AC | PRN

## 2020-09-08 SURGICAL SUPPLY — 52 items
"PENCIL ELECTRO HAND CTR " (MISCELLANEOUS) IMPLANT
APL PRP STRL LF DISP 70% ISPRP (MISCELLANEOUS) ×1
BAG DECANTER FOR FLEXI CONT (MISCELLANEOUS) ×3 IMPLANT
BLADE SURG 15 STRL LF DISP TIS (BLADE) ×1 IMPLANT
BLADE SURG 15 STRL SS (BLADE) ×3
BLADE SURG SZ11 CARB STEEL (BLADE) ×3 IMPLANT
CANISTER SUCT 1200ML W/VALVE (MISCELLANEOUS) ×1 IMPLANT
CATH ROBINSON RED A/P 16FR (CATHETERS) ×3 IMPLANT
CHLORAPREP W/TINT 26 (MISCELLANEOUS) ×3 IMPLANT
CLOSURE WOUND 1/2 X4 (GAUZE/BANDAGES/DRESSINGS) ×1
COVER WAND RF STERILE (DRAPES) IMPLANT
ELECT REM PT RETURN 9FT ADLT (ELECTROSURGICAL) ×3
ELECTRODE REM PT RTRN 9FT ADLT (ELECTROSURGICAL) ×1 IMPLANT
GLOVE BIO SURGEON STRL SZ8 (GLOVE) ×3 IMPLANT
GLOVE BIOGEL PI ORTHO PRO 7.5 (GLOVE) ×2
GLOVE PI ORTHO PRO STRL 7.5 (GLOVE) ×1 IMPLANT
GOWN STRL REUS W/ TWL LRG LVL3 (GOWN DISPOSABLE) ×2 IMPLANT
GOWN STRL REUS W/TWL LRG LVL3 (GOWN DISPOSABLE) ×6
GOWN STRL REUS W/TWL XL LVL4 (GOWN DISPOSABLE) ×3 IMPLANT
HANDLE YANKAUER SUCT BULB TIP (MISCELLANEOUS) ×2 IMPLANT
IRRIGATION STRYKERFLOW (MISCELLANEOUS) IMPLANT
IRRIGATOR STRYKERFLOW (MISCELLANEOUS)
IV LACTATED RINGERS 1000ML (IV SOLUTION) ×3 IMPLANT
KIT PINK PAD W/HEAD ARE REST (MISCELLANEOUS) ×3
KIT PINK PAD W/HEAD ARM REST (MISCELLANEOUS) ×1 IMPLANT
KIT TURNOVER CYSTO (KITS) ×3 IMPLANT
LIGASURE LAP MARYLAND 5MM 37CM (ELECTROSURGICAL) ×2 IMPLANT
MANIFOLD NEPTUNE II (INSTRUMENTS) ×2 IMPLANT
NEEDLE HYPO 22GX1.5 SAFETY (NEEDLE) ×3 IMPLANT
NS IRRIG 500ML POUR BTL (IV SOLUTION) ×3 IMPLANT
PACK GYN LAPAROSCOPIC (MISCELLANEOUS) ×3 IMPLANT
PAD OB MATERNITY 4.3X12.25 (PERSONAL CARE ITEMS) ×3 IMPLANT
PAD PREP 24X41 OB/GYN DISP (PERSONAL CARE ITEMS) ×3 IMPLANT
PENCIL ELECTRO HAND CTR (MISCELLANEOUS) ×3 IMPLANT
RETRACTOR RING XSMALL (MISCELLANEOUS) IMPLANT
RTRCTR WOUND ALEXIS 13CM XS SH (MISCELLANEOUS) ×3
SET TUBE SMOKE EVAC HIGH FLOW (TUBING) ×3 IMPLANT
SLEEVE ENDOPATH XCEL 5M (ENDOMECHANICALS) ×4 IMPLANT
STRIP CLOSURE SKIN 1/2X4 (GAUZE/BANDAGES/DRESSINGS) ×2 IMPLANT
SUT MAXON ABS #0 GS21 30IN (SUTURE) ×4 IMPLANT
SUT VIC AB 2-0 CT1 36 (SUTURE) ×4 IMPLANT
SUT VIC AB 4-0 FS2 27 (SUTURE) ×4 IMPLANT
SUT VICRYL 0 AB UR-6 (SUTURE) ×3 IMPLANT
SUT VICRYL 4-0  27 PS-2 BARIAT (SUTURE) ×2
SUT VICRYL 4-0 27 PS-2 BARIAT (SUTURE) ×1
SUTURE VICRYL 4-0 27 PS-2 BART (SUTURE) ×1 IMPLANT
TOWEL OR 17X26 4PK STRL BLUE (TOWEL DISPOSABLE) ×3 IMPLANT
TROCAR ENDO BLADELESS 11MM (ENDOMECHANICALS) IMPLANT
TROCAR XCEL NON-BLD 5MMX100MML (ENDOMECHANICALS) ×3 IMPLANT
TROCAR XCEL UNIV SLVE 11M 100M (ENDOMECHANICALS) IMPLANT
TUBING CONNECTING 10 (TUBING) ×2 IMPLANT
TUBING CONNECTING 10' (TUBING) ×1

## 2020-09-08 NOTE — OR Nursing (Signed)
MD STRAIGHT CATH WITH 16 FR CATHETER 225 MLS CLEAR YELLOW URINE OUT.

## 2020-09-08 NOTE — Op Note (Signed)
@  LOGO@    OPERATIVE NOTE 09/08/2020 11:56 AM  PRE-OPERATIVE DIAGNOSIS:  1) Pelvic pain, abdominal wall mass  POST-OPERATIVE DIAGNOSIS:  1) Same with 2) Omental abdominal wall adhesion  OPERATION:  LAPAROSCOPY DIAGNOSTIC:  LAPAROTOMY:  Mini - lap - resection of abdominal wall mass and lysis of omental adhesions  SURGEON(S): Surgeon(s) and Role:    Linzie Collin, MD - Primary    * Byrnett, Merrily Pew, MD   ANESTHESIA: General  ESTIMATED BLOOD LOSS: 10 mL   SPECIMEN:  ID Type Source Tests Collected by Time Destination  1 : ABDOMINAL ANTERIOR WALL MASS Tissue PATH Other SURGICAL PATHOLOGY Earline Mayotte, MD 09/08/2020 1125     COMPLICATIONS: None  DISPOSITION: Stable to recovery room  DESCRIPTION OF PROCEDURE:      The patient was prepped and draped in the dorsolithotomy position and placed under general anesthesia. The bladder was emptied. The cervix was grasped with a multi-toothed tenaculum and a uterine manipulator was placed within the cervical os respecting the position and curvature of the uterus. After changing gloves we proceeded abdominally. A small infraumbilical incision was made and a 5 mm trocar port was placed within the abdominopelvic cavity. The opening pressure was less than 7 mmHg.  Approximately 3 and 1/2 L of carbon dioxide gas was instilled within the abdominal pelvic cavity. The laparoscope was placed and the pelvis and abdomen were carefully inspected. A large omental adhesion was noted @2cm  below and left of the umbilical trochar. Dr. scrubbed in and resected the abdominal wall mass while I acted as his Doristine Counter.  Please see his dictation of the portion of the surgery. After resection of the abd wall mass, placing the scope in the RLQ port and using the ligasure I was able to systematically dissect the omental off of the abd wall where it was adherent.  The remainder of the pelvis appeared normal. Hemostasis of all areas of the pelvis and  abdomen was noted. The ports were removed under direct visualization.  Hemostasis was noted.  The laparoscope was removed the trocar sleeve was removed and the incision was closed with a deep suture through the fascia of 0 Vicryl followed by subcuticular closure of the skin for all port sites. A long-acting anesthetic was injected.  Steri-Strips were applied. The uterine manipulator was removed. Hemostasis of the cervix was noted. The patient went to the recovery room in stable condition.  Geophysicist/field seismologist, M.D. 09/08/2020 11:56 AM

## 2020-09-08 NOTE — Transfer of Care (Signed)
Immediate Anesthesia Transfer of Care Note  Patient: Erica Henderson  Procedure(s) Performed: LAPAROSCOPY DIAGNOSTIC (N/A ) LAPAROTOMY (N/A )  Patient Location: PACU  Anesthesia Type:General  Level of Consciousness: awake, alert  and oriented  Airway & Oxygen Therapy: Patient Spontanous Breathing and Patient connected to face mask oxygen  Post-op Assessment: Report given to RN and Post -op Vital signs reviewed and stable  Post vital signs: Reviewed and stable  Last Vitals:  Vitals Value Taken Time  BP    Temp    Pulse    Resp    SpO2      Last Pain:  Vitals:   09/08/20 0932  TempSrc: Temporal  PainSc: 7          Complications: No complications documented.

## 2020-09-08 NOTE — Interval H&P Note (Signed)
History and Physical Interval Note:  09/08/2020 9:55 AM  Erica Henderson  has presented today for surgery, with the diagnosis of Pelvic pain, abdominal wall mass.  The various methods of treatment have been discussed with the patient and family. After consideration of risks, benefits and other options for treatment, the patient has consented to  Procedure(s) with comments: LAPAROSCOPY DIAGNOSTIC (N/A) - Exploratory laparoscopy, possible mini laparotomy LAPAROTOMY (N/A) - Mini Laparotomy as a surgical intervention.  The patient's history has been reviewed, patient examined, no change in status, stable for surgery.  I have reviewed the patient's chart and labs.  Questions were answered to the patient's satisfaction.     Brennan Bailey

## 2020-09-08 NOTE — Anesthesia Preprocedure Evaluation (Signed)
Anesthesia Evaluation  Patient identified by MRN, date of birth, ID band Patient awake    Reviewed: Allergy & Precautions, NPO status , Patient's Chart, lab work & pertinent test results  History of Anesthesia Complications Negative for: history of anesthetic complications  Airway Mallampati: II  TM Distance: >3 FB Neck ROM: Full    Dental no notable dental hx. (+) Teeth Intact, Dental Advisory Given   Pulmonary neg pulmonary ROS, neg sleep apnea, neg COPD, Patient abstained from smoking.Not current smoker, former smoker,    Pulmonary exam normal breath sounds clear to auscultation       Cardiovascular Exercise Tolerance: Good METS(-) hypertension(-) CAD and (-) Past MI negative cardio ROS  (-) dysrhythmias  Rhythm:Regular Rate:Normal - Systolic murmurs    Neuro/Psych PSYCHIATRIC DISORDERS Depression negative neurological ROS     GI/Hepatic neg GERD  ,(+)     (-) substance abuse  ,   Endo/Other  neg diabetes  Renal/GU negative Renal ROS     Musculoskeletal   Abdominal   Peds  Hematology   Anesthesia Other Findings Past Medical History: No date: Depression  Reproductive/Obstetrics                             Anesthesia Physical Anesthesia Plan  ASA: II  Anesthesia Plan: General   Post-op Pain Management:    Induction: Intravenous  PONV Risk Score and Plan: 4 or greater and Ondansetron, Dexamethasone, Midazolam, Propofol infusion and TIVA  Airway Management Planned: Oral ETT  Additional Equipment: None  Intra-op Plan:   Post-operative Plan: Extubation in OR  Informed Consent: I have reviewed the patients History and Physical, chart, labs and discussed the procedure including the risks, benefits and alternatives for the proposed anesthesia with the patient or authorized representative who has indicated his/her understanding and acceptance.     Dental advisory  given  Plan Discussed with: CRNA and Surgeon  Anesthesia Plan Comments: (Discussed risks of anesthesia with patient, including PONV, sore throat, lip/dental damage. Rare risks discussed as well, such as cardiorespiratory and neurological sequelae. Patient understands.)        Anesthesia Quick Evaluation

## 2020-09-08 NOTE — Discharge Instructions (Signed)

## 2020-09-08 NOTE — Op Note (Signed)
Preoperative diagnosis: Abdominal wall mass.  Postoperative diagnosis: Same.  Operative procedure: Excision of abdominal wall mass.  Operating surgeon: Donnalee Curry, MD.  Assistant: Brennan Bailey, MD.  Anesthesia: General endotracheal.  Marcaine 0.5%, plain, 20 cc.  Estimated blood loss: 5 cc.  Clinical note: This 29 year old woman had report of pain in the anterior abdominal wall.  There had been a suggestion of a palpable mass, which I did not appreciate an outpatient evaluation.  She was admitted today for planned diagnostic laparoscopy and exam under anesthesia.  Laparoscopy was completed by Dr. Logan Bores and when the patient was asleep you could palpate a firm area about 3-4 cm below the umbilical fold extending to the left that was firm.  Laparoscopy showed a band of adhesions from the omentum to the anterior abdominal wall but no other pathology.  Local anesthesia was infiltrated and a 5-6 cm incision was made vertically oriented over the palpable mass which extended from the midline to the left.  The skin was incised sharply and then the soft tissues were elevated off the underlying fascia circumferentially to allow placement of an extra small Alexis wound protector.  This hard mass which measured about 1-1.3 cm in diameter was excised with electrocautery.  This encompassed part of the anterior rectus fascia.  This did not appear to involve the muscle or the deep midline fascia.  After this was excised, the fascia was reapproximated with interrupted 0 Maxon figure-of-eight sutures.  The specimen was sent in formalin for routine histology.  Soft tissues were closed with a running 2-0 Vicryl suture in 2 layers.  The skin was closed with a running 4-0 Vicryl subcuticular suture.  Final wound dressings will be applied by Dr. Logan Bores at the termination of the final laparoscopic exam.

## 2020-09-08 NOTE — Anesthesia Procedure Notes (Signed)
Procedure Name: Intubation Date/Time: 09/08/2020 10:15 AM Performed by: Rona Ravens, CRNA Pre-anesthesia Checklist: Patient identified, Emergency Drugs available, Suction available, Patient being monitored and Timeout performed Patient Re-evaluated:Patient Re-evaluated prior to induction Oxygen Delivery Method: Circle system utilized Preoxygenation: Pre-oxygenation with 100% oxygen Induction Type: IV induction Ventilation: Mask ventilation without difficulty Laryngoscope Size: Mac and 3 Grade View: Grade II Tube type: Oral Tube size: 6.5 mm Number of attempts: 1 Placement Confirmation: ETT inserted through vocal cords under direct vision,  positive ETCO2,  CO2 detector and breath sounds checked- equal and bilateral Secured at: 21 cm Tube secured with: Tape Dental Injury: Teeth and Oropharynx as per pre-operative assessment

## 2020-09-11 LAB — SURGICAL PATHOLOGY

## 2020-09-12 NOTE — Anesthesia Postprocedure Evaluation (Signed)
Anesthesia Post Note  Patient: Erica Henderson  Procedure(s) Performed: LAPAROSCOPY DIAGNOSTIC (N/A ) LAPAROTOMY (N/A )  Patient location during evaluation: PACU Anesthesia Type: General Level of consciousness: awake and alert Pain management: pain level controlled Vital Signs Assessment: post-procedure vital signs reviewed and stable Respiratory status: spontaneous breathing, nonlabored ventilation, respiratory function stable and patient connected to nasal cannula oxygen Cardiovascular status: blood pressure returned to baseline and stable Postop Assessment: no apparent nausea or vomiting Anesthetic complications: no   No complications documented.   Last Vitals:  Vitals:   09/08/20 1426 09/08/20 1514  BP: 135/89 124/75  Pulse: 82 74  Resp: 16 16  Temp: (!) 36.2 C   SpO2: 100% 100%    Last Pain:  Vitals:   09/11/20 0816  TempSrc:   PainSc: 9                  Corinda Gubler

## 2020-09-15 ENCOUNTER — Ambulatory Visit (INDEPENDENT_AMBULATORY_CARE_PROVIDER_SITE_OTHER): Payer: Medicaid Other | Admitting: Obstetrics and Gynecology

## 2020-09-15 ENCOUNTER — Encounter: Payer: Self-pay | Admitting: Obstetrics and Gynecology

## 2020-09-15 ENCOUNTER — Other Ambulatory Visit: Payer: Self-pay

## 2020-09-15 VITALS — BP 119/83 | HR 89 | Ht 67.0 in | Wt 148.0 lb

## 2020-09-15 DIAGNOSIS — N809 Endometriosis, unspecified: Secondary | ICD-10-CM

## 2020-09-15 DIAGNOSIS — R109 Unspecified abdominal pain: Secondary | ICD-10-CM

## 2020-09-15 NOTE — Progress Notes (Signed)
HPI:      [name] is a 29 y.o. [id number] who LMP was Patient's last menstrual period was 08/22/2020 (approximate).  Subjective:   She presents today approximately 1 week postop.  She reports she is doing well.  She is not having significant pain.    Hx: The following portions of the patient's history were reviewed and updated as appropriate:             She  has a past medical history of Depression. She does not have a problem list on file. She  has a past surgical history that includes Tubal ligation; Cesarean section; Shoulder arthroscopy (Right); laparoscopy (N/A, 09/08/2020); and laparotomy (N/A, 09/08/2020). Her family history includes Diabetes in her father. She  reports that she has quit smoking. She has never used smokeless tobacco. She reports current alcohol use. She reports current drug use. Drug: Marijuana. She has a current medication list which includes the following prescription(s): hydrocodone-acetaminophen and sertraline. She has No Known Allergies.       Review of Systems:  Review of Systems  Constitutional: Denied constitutional symptoms, night sweats, recent illness, fatigue, fever, insomnia and weight loss.  Eyes: Denied eye symptoms, eye pain, photophobia, vision change and visual disturbance.  Ears/Nose/Throat/Neck: Denied ear, nose, throat or neck symptoms, hearing loss, nasal discharge, sinus congestion and sore throat.  Cardiovascular: Denied cardiovascular symptoms, arrhythmia, chest pain/pressure, edema, exercise intolerance, orthopnea and palpitations.  Respiratory: Denied pulmonary symptoms, asthma, pleuritic pain, productive sputum, cough, dyspnea and wheezing.  Gastrointestinal: Denied, gastro-esophageal reflux, melena, nausea and vomiting.  Genitourinary: Denied genitourinary symptoms including symptomatic vaginal discharge, pelvic relaxation issues, and urinary complaints.  Musculoskeletal: Denied musculoskeletal symptoms, stiffness,  swelling, muscle weakness and myalgia.  Dermatologic: Denied dermatology symptoms, rash and scar.  Neurologic: Denied neurology symptoms, dizziness, headache, neck pain and syncope.  Psychiatric: Denied psychiatric symptoms, anxiety and depression.  Endocrine: Denied endocrine symptoms including hot flashes and night sweats.   Meds:   Current Outpatient Medications on File Prior to Visit  Medication Sig Dispense Refill  . HYDROcodone-acetaminophen (NORCO/VICODIN) 5-325 MG tablet Take 1 tablet by mouth every 6 (six) hours as needed for moderate pain. 15 tablet 0  . sertraline (ZOLOFT) 100 MG tablet Take 100 mg by mouth daily.     No current facility-administered medications on file prior to visit.    Objective:     Vitals:   09/15/20 0953  BP: 119/83  Pulse: 89   Filed Weights   09/15/20 0953  Weight: 148 lb (67.1 kg)               Abdomen: Soft.  Non-tender.  No masses.  No HSM.  Incision/s: Intact.  Healing well.  No erythema.  No drainage.      Assessment:    G2P1102 There are no problems to display for this patient.    1. Abdominal pain, unspecified abdominal location   2. Deep infiltrative endometriosis     Abdominal wall endometriosis.  Discussed this diagnosis in detail with the patient.  Discussed surgical management in detail.  All questions answered.   Plan:            1.  Wound care discussed.  2.  Follow-up with Dr. Doristine Counter as scheduled.  3.  Follow-up in 5 weeks  4.  Patient may return to usual activities without restriction with the exception of heavy lifting abdominal wall straining activity. Orders No orders of the defined types were placed in this encounter.  No orders of the defined types were placed in this encounter.     F/U  Return in about 5 weeks (around 10/20/2020).  Elonda Husky, M.D. 09/15/2020 10:26 AM

## 2020-10-26 ENCOUNTER — Encounter: Payer: Medicaid Other | Admitting: Obstetrics and Gynecology

## 2020-11-07 ENCOUNTER — Encounter: Payer: Self-pay | Admitting: Obstetrics and Gynecology

## 2021-08-11 IMAGING — CT CT ABD-PELV W/ CM
1 of 2 series · 15 of 32 positions shown, 19 images · IV contrast (APPLIED)
Comparison: Ultrasound 08/13/2018

CLINICAL DATA: Left lower quadrant pain pelvic pain

EXAM:
CT ABDOMEN AND PELVIS WITH CONTRAST
TECHNIQUE: Multidetector CT imaging of the abdomen and pelvis was performed
using the standard protocol following bolus administration of
intravenous contrast.
CONTRAST:  100mL OMNIPAQUE IOHEXOL 300 MG/ML  SOLN

[Series 2: axial st · axial · 0.68mm/px · z∈[-829,-409]mm · 15 of 92 slices shown, 19 images]
[im 4/92  soft-tissue]
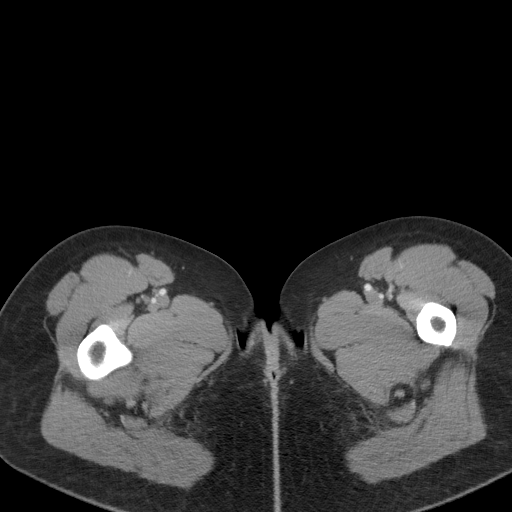
[im 4/92  bone]
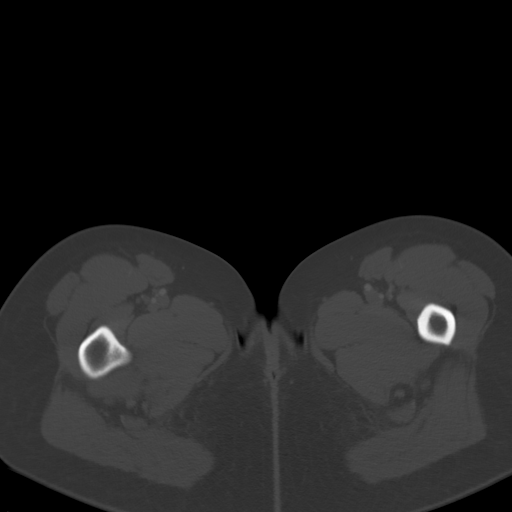
[im 12/92  soft-tissue]
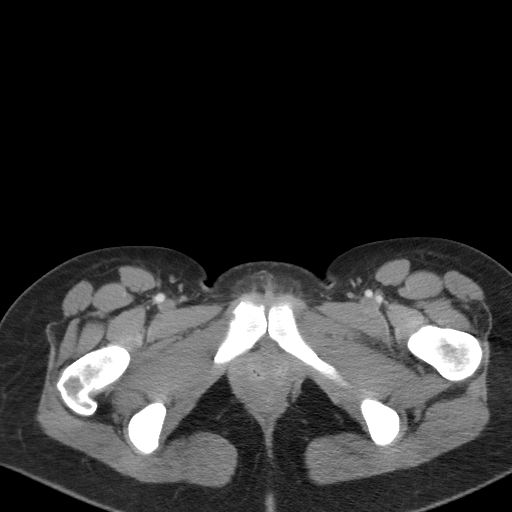
[im 20/92  soft-tissue]
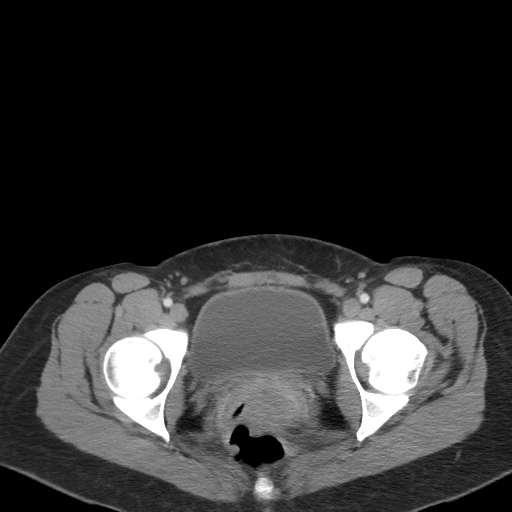
[im 24/92  soft-tissue]
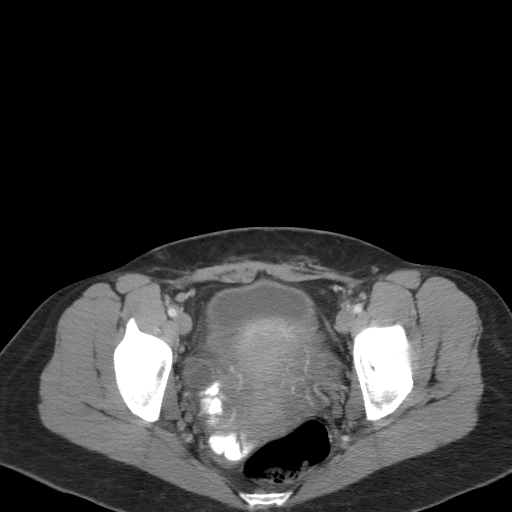
[im 32/92  soft-tissue]
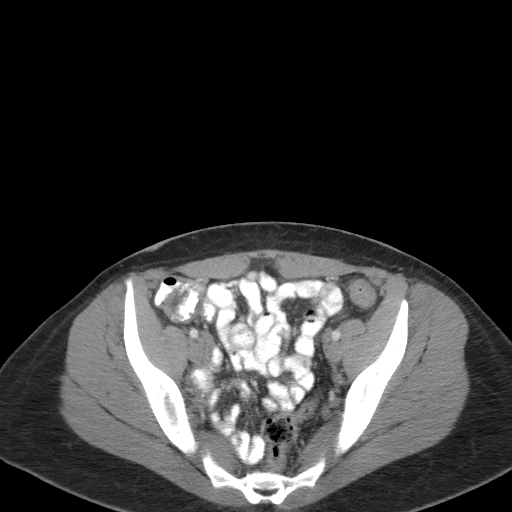
[im 40/92  soft-tissue]
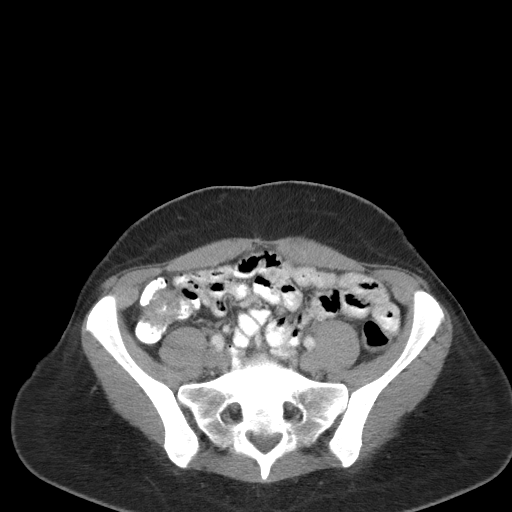
[im 48/92  soft-tissue]
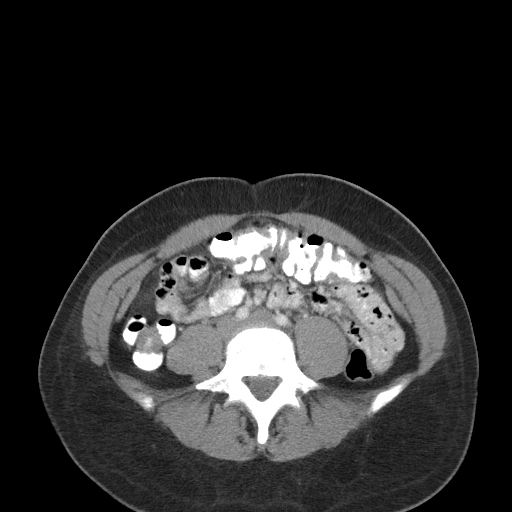
[im 52/92  soft-tissue]
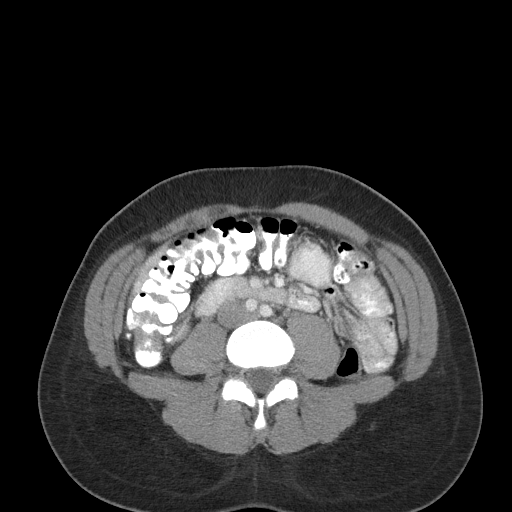
[im 60/92  soft-tissue]
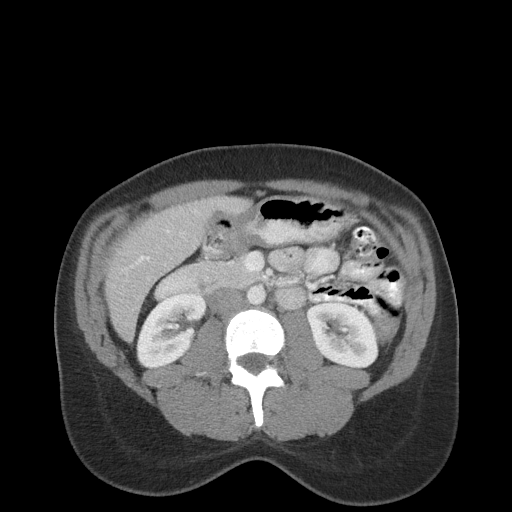
[im 60/92  bone]
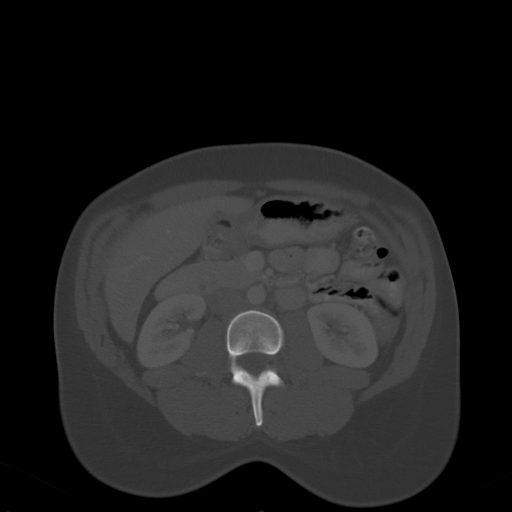
[im 68/92  soft-tissue]
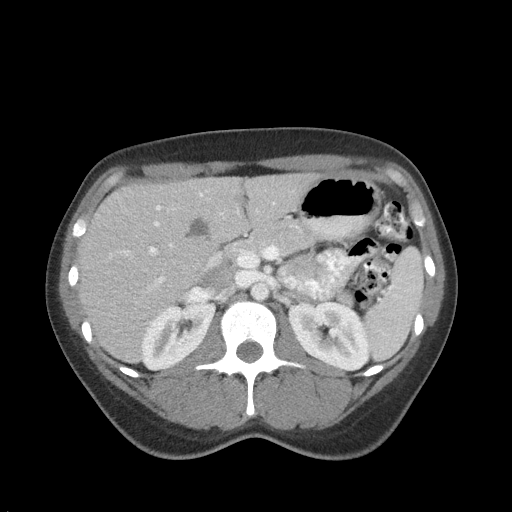
[im 72/92  soft-tissue]
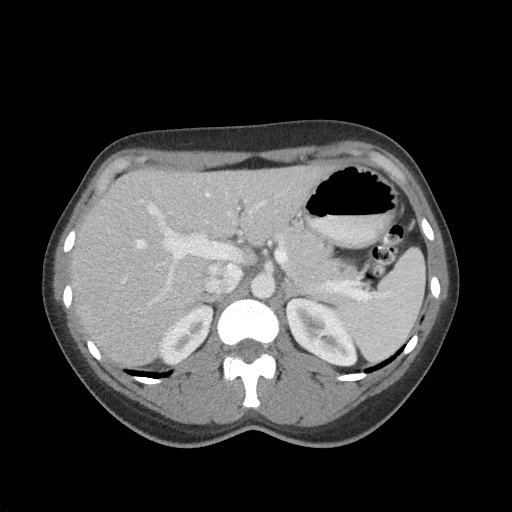
[im 76/92  lung]
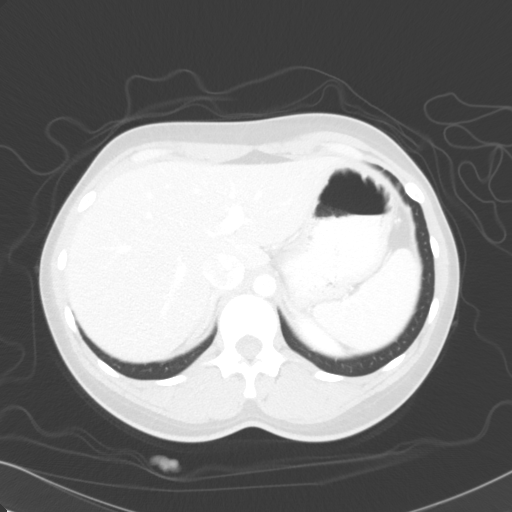
[im 80/92  soft-tissue]
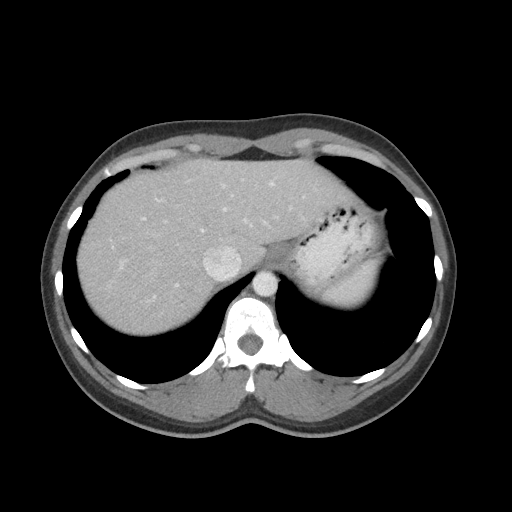
[im 80/92  lung]
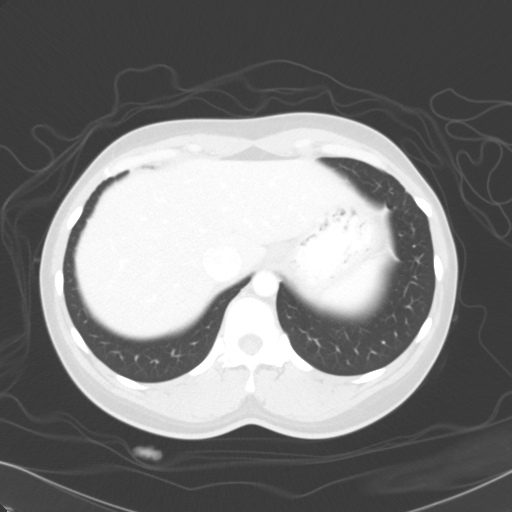
[im 84/92  lung]
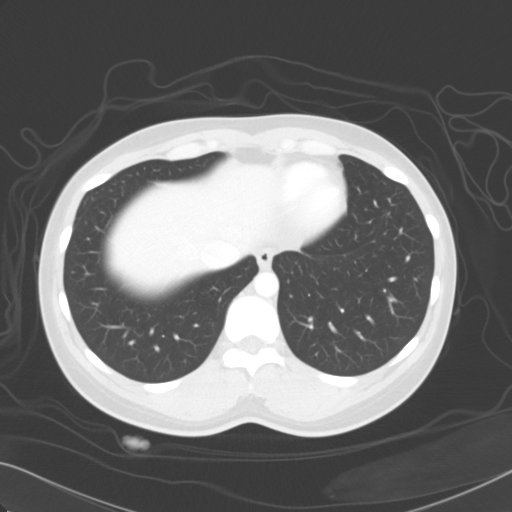
[im 88/92  soft-tissue]
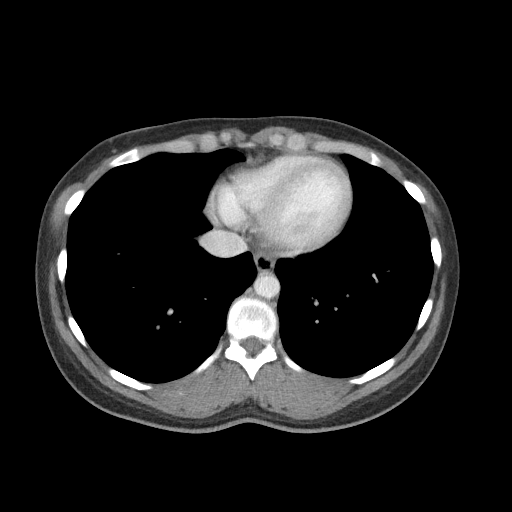
[im 88/92  lung]
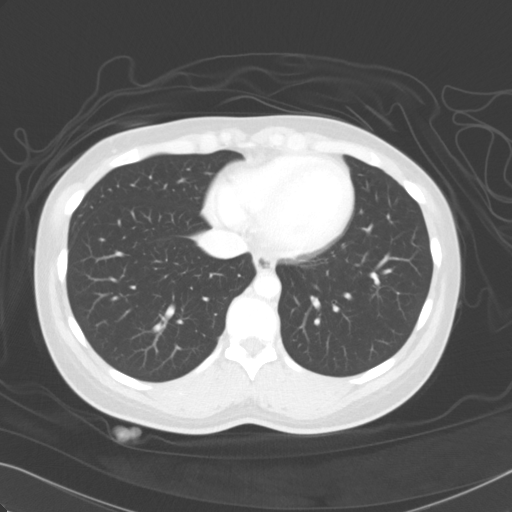

[15 of 32 positions shown; findings below may reference images not displayed]

FINDINGS: Lower chest: No acute abnormality.

Hepatobiliary: No focal liver abnormality is seen. No gallstones,
gallbladder wall thickening, or biliary dilatation. Probable small
focus of fat infiltration at the falciform ligament.

Pancreas: Unremarkable. No pancreatic ductal dilatation or
surrounding inflammatory changes.

Spleen: Normal in size without focal abnormality.

Adrenals/Urinary Tract: Adrenal glands are unremarkable. Kidneys are
normal, without renal calculi, focal lesion, or hydronephrosis.
Bladder is unremarkable.

Stomach/Bowel: Stomach is nonenlarged. Slight thickening at the
pylorus. No dilated small bowel. Suspected patchy areas of wall
thickening involving the distal descending/sigmoid colon and the
ascending colon. Slightly thickened appearance of the terminal
ileum.

Vascular/Lymphatic: No significant vascular findings are present. No
enlarged abdominal or pelvic lymph nodes.

Reproductive: Uterus and bilateral adnexa are unremarkable. Small
amount of air in the vagina.

Other: Negative for free air or free fluid.

Musculoskeletal: No acute or significant osseous findings.
IMPRESSION: 1. No definite CT evidence for acute intra-abdominal or pelvic
abnormality. Negative for a pelvic mass or pelvic inflammatory
process
2. Patchy areas of mild wall thickening involving the colon with
slight thickened appearance of the terminal ileum, question
infectious or inflammatory bowel disease.

## 2021-11-29 ENCOUNTER — Other Ambulatory Visit: Payer: Self-pay | Admitting: Obstetrics and Gynecology

## 2021-11-29 DIAGNOSIS — R1031 Right lower quadrant pain: Secondary | ICD-10-CM

## 2021-11-30 ENCOUNTER — Ambulatory Visit: Admission: RE | Admit: 2021-11-30 | Payer: Medicaid Other | Source: Ambulatory Visit

## 2021-12-21 ENCOUNTER — Other Ambulatory Visit: Payer: Self-pay

## 2021-12-21 ENCOUNTER — Ambulatory Visit
Admission: RE | Admit: 2021-12-21 | Discharge: 2021-12-21 | Disposition: A | Discharge status: Home / Self Care | Payer: Medicaid Other | Source: Ambulatory Visit | Attending: Obstetrics and Gynecology | Admitting: Obstetrics and Gynecology

## 2021-12-21 DIAGNOSIS — R1032 Left lower quadrant pain: Secondary | ICD-10-CM | POA: Insufficient documentation

## 2021-12-21 DIAGNOSIS — R1031 Right lower quadrant pain: Secondary | ICD-10-CM | POA: Insufficient documentation

## 2021-12-21 MED ORDER — GADOBUTROL 1 MMOL/ML IV SOLN
7.0000 mL | Freq: Once | INTRAVENOUS | Status: AC | PRN
  Administered 2021-12-21: 7 mL via INTRAVENOUS

## 2022-12-11 ENCOUNTER — Emergency Department
Admission: EM | Admit: 2022-12-11 | Discharge: 2022-12-11 | Disposition: A | Discharge status: Home / Self Care | Payer: Medicaid Other | Attending: Emergency Medicine | Admitting: Emergency Medicine

## 2022-12-11 ENCOUNTER — Emergency Department: Payer: Medicaid Other

## 2022-12-11 ENCOUNTER — Other Ambulatory Visit: Payer: Self-pay

## 2022-12-11 DIAGNOSIS — M25572 Pain in left ankle and joints of left foot: Secondary | ICD-10-CM | POA: Diagnosis present

## 2022-12-11 DIAGNOSIS — L03116 Cellulitis of left lower limb: Secondary | ICD-10-CM | POA: Diagnosis not present

## 2022-12-11 MED ORDER — HYDROCODONE-ACETAMINOPHEN 5-325 MG PO TABS: 1.0000 | ORAL_TABLET | ORAL | 0 refills | Status: AC | PRN

## 2022-12-11 MED ORDER — CEPHALEXIN 500 MG PO CAPS: 500.0000 mg | ORAL_CAPSULE | Freq: Three times a day (TID) | ORAL | 0 refills | Status: AC
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
## 2023-03-26 ENCOUNTER — Encounter: Payer: Medicaid Other | Admitting: Obstetrics and Gynecology

## 2023-03-26 DIAGNOSIS — G8929 Other chronic pain: Secondary | ICD-10-CM

## 2023-04-15 ENCOUNTER — Encounter: Payer: Self-pay | Admitting: Obstetrics & Gynecology

## 2023-04-15 ENCOUNTER — Ambulatory Visit (INDEPENDENT_AMBULATORY_CARE_PROVIDER_SITE_OTHER): Payer: Medicaid Other | Admitting: Obstetrics & Gynecology

## 2023-04-15 VITALS — BP 135/78 | HR 88 | Ht 67.0 in | Wt 181.0 lb

## 2023-04-15 DIAGNOSIS — N809 Endometriosis, unspecified: Secondary | ICD-10-CM

## 2023-04-15 MED ORDER — NORGESTREL-ETHINYL ESTRADIOL 0.3-30 MG-MCG PO TABS: ORAL_TABLET | ORAL | 11 refills | Status: AC

## 2023-04-15 NOTE — Progress Notes (Signed)
   Established Patient Office Visit  Subjective   Patient ID: Erica Henderson, female    DOB: Apr 03, 1991  Age: 32 y.o. MRN: [id number]  Chief Complaint  Patient presents with   Pelvic Pain    Pain everyday but pain is more intense when on periods, heavy bleeding, clots,     HPI   Patient Active Problem List   Diagnosis Date Noted   Endometriosis determined by laparoscopy 04/15/2023   Past Medical History:  Diagnosis Date   Depression    Past Surgical History:  Procedure Laterality Date   CESAREAN SECTION     LAPAROSCOPY N/A 09/08/2020   Procedure: LAPAROSCOPY DIAGNOSTIC;  Surgeon: Linzie Collin, MD;  Location: [facility] ORS;  Service: Gynecology;  Laterality: N/A;  Exploratory laparoscopy, possible mini laparotomy   LAPAROTOMY N/A 09/08/2020   Procedure: LAPAROTOMY;  Surgeon: Linzie Collin, MD;  Location: [facility] ORS;  Service: Gynecology;  Laterality: N/A;  Mini Laparotomy   SHOULDER ARTHROSCOPY Right    TUBAL LIGATION        ROS    31yo P2 is here with terrible pain in her abdominal wall, starts around the umbilicus and is primarily on the left side. She had an abdominal wall mass removed in 2021 that was proven to be endometriosis. The pain is always present, worse with her periods. She had a BTL in the past. She tried depo Lupron in the past but did not like the side effects.  ROS- she reports a normal pap smear within the last year.  She has chronic constipation. Objective:     BP 135/78   Pulse 88   Ht 5\' 7"  (1.702 m)   Wt 181 lb (82.1 kg)   LMP 04/14/2023   BMI 28.35 kg/m    Physical Exam   Well nourished, well hydrated Black female, no apparent distress She is ambulating and conversing normally. Abd- tender abdominal wall in the adipose tissue  Assessment & Plan:   Problem List Items Addressed This Visit     Endometriosis determined by laparoscopy - Primary    She will start Lo ovral in continuous fashion. We discussed Orilissa versus  surgery.   Allie Bossier, MD
